# Patient Record
Sex: Female | Born: 1955 | Race: Black or African American | Hispanic: No | Marital: Single
Health system: Southern US, Community
[De-identification: ages and names within clinical notes are randomized; demographics above are authoritative.]

---

## 2010-02-25 ENCOUNTER — Ambulatory Visit: Payer: Self-pay | Admitting: Oncology

## 2010-03-13 ENCOUNTER — Inpatient Hospital Stay: Payer: Self-pay | Admitting: Internal Medicine

## 2010-03-28 ENCOUNTER — Ambulatory Visit: Payer: Self-pay | Admitting: Oncology

## 2010-04-05 ENCOUNTER — Inpatient Hospital Stay: Payer: Self-pay | Admitting: Internal Medicine

## 2010-09-01 ENCOUNTER — Inpatient Hospital Stay: Payer: Self-pay | Admitting: Internal Medicine

## 2010-11-08 ENCOUNTER — Inpatient Hospital Stay: Payer: Self-pay | Admitting: Internal Medicine

## 2011-05-19 ENCOUNTER — Ambulatory Visit: Payer: Self-pay | Admitting: Surgery

## 2012-01-27 ENCOUNTER — Ambulatory Visit: Payer: Self-pay | Admitting: Internal Medicine

## 2012-02-09 ENCOUNTER — Inpatient Hospital Stay: Payer: Self-pay | Admitting: Internal Medicine

## 2012-02-09 LAB — BASIC METABOLIC PANEL
BUN: 9 mg/dL (ref 7–18)
Calcium, Total: 7.1 mg/dL — ABNORMAL LOW (ref 8.5–10.1)
Chloride: 97 mmol/L — ABNORMAL LOW (ref 98–107)
Co2: 11 mmol/L — ABNORMAL LOW (ref 21–32)
Glucose: 127 mg/dL — ABNORMAL HIGH (ref 65–99)
Osmolality: 269 (ref 275–301)
Potassium: 3.4 mmol/L — ABNORMAL LOW (ref 3.5–5.1)
Sodium: 134 mmol/L — ABNORMAL LOW (ref 136–145)

## 2012-02-09 LAB — COMPREHENSIVE METABOLIC PANEL
Albumin: 3.6 g/dL (ref 3.4–5.0)
Alkaline Phosphatase: 88 U/L (ref 50–136)
Anion Gap: 30 — ABNORMAL HIGH (ref 7–16)
Bilirubin,Total: 0.7 mg/dL (ref 0.2–1.0)
Co2: 12 mmol/L — ABNORMAL LOW (ref 21–32)
Creatinine: 0.94 mg/dL (ref 0.60–1.30)
EGFR (Non-African Amer.): >60
Glucose: 122 mg/dL — ABNORMAL HIGH (ref 65–99)
Osmolality: 267 (ref 275–301)
Potassium: 5.5 mmol/L — ABNORMAL HIGH (ref 3.5–5.1)
Sodium: 133 mmol/L — ABNORMAL LOW (ref 136–145)

## 2012-02-09 LAB — CBC WITH DIFFERENTIAL/PLATELET
Eosinophil #: 0 10*3/uL (ref 0.0–0.7)
Eosinophil %: 0.1 %
HCT: 39.6 % (ref 35.0–47.0)
HGB: 12.5 g/dL (ref 12.0–16.0)
Lymphocyte #: 0.9 10*3/uL — ABNORMAL LOW (ref 1.0–3.6)
Lymphocyte %: 21.7 %
MCV: 91 fL (ref 80–100)
Monocyte %: 5 %
Neutrophil #: 3.1 10*3/uL (ref 1.4–6.5)
RBC: 4.37 10*6/uL (ref 3.80–5.20)
WBC: 4.3 10*3/uL (ref 3.6–11.0)

## 2012-02-09 LAB — URINALYSIS, COMPLETE
Bilirubin,UR: NEGATIVE
Glucose,UR: NEGATIVE mg/dL (ref 0–75)
Hyaline Cast: 8
Protein: 30
Squamous Epithelial: 1

## 2012-02-09 LAB — PROTIME-INR: INR: 1

## 2012-02-09 LAB — ETHANOL
Ethanol %: <0.003 % (ref 0.000–0.080)
Ethanol: <3 mg/dL

## 2012-02-09 LAB — POTASSIUM: Potassium: 4.3 mmol/L (ref 3.5–5.1)

## 2012-02-09 LAB — TROPONIN I: Troponin-I: <0.02 ng/mL

## 2012-02-10 LAB — HEMOGLOBIN A1C: Hemoglobin A1C: 5 % (ref 4.2–6.3)

## 2012-02-10 LAB — BASIC METABOLIC PANEL
Anion Gap: 13 (ref 7–16)
Anion Gap: 8 (ref 7–16)
BUN: 7 mg/dL (ref 7–18)
BUN: 4 mg/dL — ABNORMAL LOW (ref 7–18)
Calcium, Total: 6 mg/dL — CL (ref 8.5–10.1)
Chloride: 104 mmol/L (ref 98–107)
Creatinine: 0.93 mg/dL (ref 0.60–1.30)
EGFR (African American): >60
EGFR (African American): >60
Glucose: 139 mg/dL — ABNORMAL HIGH (ref 65–99)
Glucose: 120 mg/dL — ABNORMAL HIGH (ref 65–99)
Potassium: 3.1 mmol/L — ABNORMAL LOW (ref 3.5–5.1)
Potassium: 3.7 mmol/L (ref 3.5–5.1)
Sodium: 140 mmol/L (ref 136–145)

## 2012-02-10 LAB — CBC WITH DIFFERENTIAL/PLATELET
Basophil #: 0 10*3/uL (ref 0.0–0.1)
Basophil %: 0.7 %
Eosinophil #: 0 10*3/uL (ref 0.0–0.7)
HCT: 27.9 % — ABNORMAL LOW (ref 35.0–47.0)
Lymphocyte #: 1.4 10*3/uL (ref 1.0–3.6)
Lymphocyte %: 36.5 %
Monocyte #: 0.2 x10 3/mm (ref 0.2–0.9)
Neutrophil #: 2.2 10*3/uL (ref 1.4–6.5)
Neutrophil %: 57 %
RDW: 14.8 % — ABNORMAL HIGH (ref 11.5–14.5)
WBC: 3.9 10*3/uL (ref 3.6–11.0)

## 2012-02-10 LAB — MAGNESIUM: Magnesium: 1.6 mg/dL — ABNORMAL LOW

## 2012-02-10 LAB — TROPONIN I: Troponin-I: <0.02 ng/mL

## 2012-02-11 LAB — BASIC METABOLIC PANEL
Chloride: 100 mmol/L (ref 98–107)
Co2: 32 mmol/L (ref 21–32)
EGFR (Non-African Amer.): >60
Glucose: 217 mg/dL — ABNORMAL HIGH (ref 65–99)
Osmolality: 281 (ref 275–301)
Potassium: 3.3 mmol/L — ABNORMAL LOW (ref 3.5–5.1)
Sodium: 139 mmol/L (ref 136–145)

## 2012-02-11 LAB — SALICYLATE LEVEL: Salicylates, Serum: <1.7 mg/dL

## 2012-02-12 LAB — COMPREHENSIVE METABOLIC PANEL
Alkaline Phosphatase: 61 U/L (ref 50–136)
Anion Gap: 2 — ABNORMAL LOW (ref 7–16)
BUN: <1 mg/dL — ABNORMAL LOW (ref 7–18)
Bilirubin,Total: 0.4 mg/dL (ref 0.2–1.0)
Calcium, Total: 6.7 mg/dL — CL (ref 8.5–10.1)
Chloride: 99 mmol/L (ref 98–107)
Co2: 37 mmol/L — ABNORMAL HIGH (ref 21–32)
EGFR (African American): >60
EGFR (Non-African Amer.): >60
Glucose: 128 mg/dL — ABNORMAL HIGH (ref 65–99)
Potassium: 3.4 mmol/L — ABNORMAL LOW (ref 3.5–5.1)
SGOT(AST): 23 U/L (ref 15–37)
SGPT (ALT): 15 U/L (ref 12–78)
Sodium: 138 mmol/L (ref 136–145)

## 2012-02-12 LAB — MAGNESIUM
Magnesium: 1.4 mg/dL — ABNORMAL LOW
Magnesium: 1.7 mg/dL — ABNORMAL LOW

## 2012-02-12 LAB — POTASSIUM: Potassium: 3.1 mmol/L — ABNORMAL LOW (ref 3.5–5.1)

## 2012-02-12 LAB — PHOSPHORUS
Phosphorus: 0.3 mg/dL — CL (ref 2.5–4.9)
Phosphorus: 1.7 mg/dL — ABNORMAL LOW (ref 2.5–4.9)

## 2012-02-13 LAB — BASIC METABOLIC PANEL
Anion Gap: 5 — ABNORMAL LOW (ref 7–16)
Chloride: 97 mmol/L — ABNORMAL LOW (ref 98–107)
Co2: 36 mmol/L — ABNORMAL HIGH (ref 21–32)
Glucose: 230 mg/dL — ABNORMAL HIGH (ref 65–99)

## 2012-02-13 LAB — MAGNESIUM: Magnesium: 1.5 mg/dL — ABNORMAL LOW

## 2012-02-14 LAB — CBC WITH DIFFERENTIAL/PLATELET
Basophil #: 0 10*3/uL (ref 0.0–0.1)
Basophil %: 0.3 %
Eosinophil #: 0 10*3/uL (ref 0.0–0.7)
Eosinophil %: 0.3 %
Lymphocyte #: 0.3 10*3/uL — ABNORMAL LOW (ref 1.0–3.6)
MCH: 28.7 pg (ref 26.0–34.0)
MCHC: 33.1 g/dL (ref 32.0–36.0)
MCV: 87 fL (ref 80–100)
Neutrophil %: 87.3 %
Platelet: 124 10*3/uL — ABNORMAL LOW (ref 150–440)
RDW: 14.8 % — ABNORMAL HIGH (ref 11.5–14.5)

## 2012-02-14 LAB — PATHOLOGY REPORT

## 2012-02-14 LAB — POTASSIUM: Potassium: 2.7 mmol/L — ABNORMAL LOW (ref 3.5–5.1)

## 2012-02-14 LAB — MAGNESIUM
Magnesium: 1.6 mg/dL — ABNORMAL LOW
Magnesium: 2.5 mg/dL — ABNORMAL HIGH

## 2012-02-15 DIAGNOSIS — J984 Other disorders of lung: Secondary | ICD-10-CM

## 2012-02-15 LAB — BASIC METABOLIC PANEL
Anion Gap: 5 — ABNORMAL LOW (ref 7–16)
BUN: 2 mg/dL — ABNORMAL LOW (ref 7–18)
Calcium, Total: 6 mg/dL — CL (ref 8.5–10.1)
Chloride: 103 mmol/L (ref 98–107)
Co2: 32 mmol/L (ref 21–32)
EGFR (African American): >60
Glucose: 121 mg/dL — ABNORMAL HIGH (ref 65–99)
Osmolality: 277 (ref 275–301)
Potassium: 4.1 mmol/L (ref 3.5–5.1)

## 2012-02-15 LAB — CBC WITH DIFFERENTIAL/PLATELET
Basophil %: 0.1 %
Eosinophil %: 0.4 %
HCT: 24.4 % — ABNORMAL LOW (ref 35.0–47.0)
HGB: 8.1 g/dL — ABNORMAL LOW (ref 12.0–16.0)
Lymphocyte %: 12.7 %
MCH: 28.7 pg (ref 26.0–34.0)
MCHC: 33.3 g/dL (ref 32.0–36.0)
Neutrophil #: 4.1 10*3/uL (ref 1.4–6.5)
Neutrophil %: 78.6 %
RBC: 2.84 10*6/uL — ABNORMAL LOW (ref 3.80–5.20)

## 2012-02-15 LAB — URINALYSIS, COMPLETE
Nitrite: NEGATIVE
Ph: 5 (ref 4.5–8.0)
Protein: NEGATIVE
Specific Gravity: 1.034 (ref 1.003–1.030)
Squamous Epithelial: 1

## 2012-02-16 LAB — BASIC METABOLIC PANEL
Anion Gap: 7 (ref 7–16)
Calcium, Total: 5.8 mg/dL — CL (ref 8.5–10.1)
Chloride: 104 mmol/L (ref 98–107)
Co2: 29 mmol/L (ref 21–32)
Creatinine: 0.89 mg/dL (ref 0.60–1.30)
EGFR (African American): >60
Glucose: 144 mg/dL — ABNORMAL HIGH (ref 65–99)
Osmolality: 278 (ref 275–301)
Potassium: 4.1 mmol/L (ref 3.5–5.1)
Sodium: 140 mmol/L (ref 136–145)

## 2012-02-16 LAB — PHOSPHORUS
Phosphorus: 2 mg/dL — ABNORMAL LOW (ref 2.5–4.9)
Phosphorus: 2.1 mg/dL — ABNORMAL LOW (ref 2.5–4.9)

## 2012-02-16 LAB — CBC WITH DIFFERENTIAL/PLATELET
Basophil #: 0 10*3/uL (ref 0.0–0.1)
Basophil %: 0.3 %
Eosinophil #: 0.1 10*3/uL (ref 0.0–0.7)
HCT: 21.7 % — ABNORMAL LOW (ref 35.0–47.0)
HGB: 7.2 g/dL — ABNORMAL LOW (ref 12.0–16.0)
MCH: 28.7 pg (ref 26.0–34.0)
MCHC: 33.4 g/dL (ref 32.0–36.0)
MCV: 86 fL (ref 80–100)
Monocyte #: 0.3 x10 3/mm (ref 0.2–0.9)
Neutrophil #: 4.3 10*3/uL (ref 1.4–6.5)
Neutrophil %: 80.3 %
Platelet: 116 10*3/uL — ABNORMAL LOW (ref 150–440)
RBC: 2.52 10*6/uL — ABNORMAL LOW (ref 3.80–5.20)
RDW: 15 % — ABNORMAL HIGH (ref 11.5–14.5)

## 2012-02-16 LAB — MAGNESIUM
Magnesium: 1.1 mg/dL — ABNORMAL LOW
Magnesium: 2 mg/dL

## 2012-02-16 LAB — HEMOGLOBIN: HGB: 7.6 g/dL — ABNORMAL LOW (ref 12.0–16.0)

## 2012-02-16 LAB — ALBUMIN: Albumin: 1.7 g/dL — ABNORMAL LOW (ref 3.4–5.0)

## 2012-02-17 LAB — PHOSPHORUS: Phosphorus: 2.2 mg/dL — ABNORMAL LOW (ref 2.5–4.9)

## 2012-02-17 LAB — CBC WITH DIFFERENTIAL/PLATELET
Basophil #: 0 10*3/uL (ref 0.0–0.1)
Basophil %: 0.4 %
Eosinophil #: 0 10*3/uL (ref 0.0–0.7)
Lymphocyte #: 0.5 10*3/uL — ABNORMAL LOW (ref 1.0–3.6)
Lymphocyte %: 6 %
MCH: 28.5 pg (ref 26.0–34.0)
MCHC: 33.1 g/dL (ref 32.0–36.0)
MCV: 86 fL (ref 80–100)
Neutrophil #: 6.7 10*3/uL — ABNORMAL HIGH (ref 1.4–6.5)
RBC: 2.88 10*6/uL — ABNORMAL LOW (ref 3.80–5.20)
RDW: 15.2 % — ABNORMAL HIGH (ref 11.5–14.5)
WBC: 7.6 10*3/uL (ref 3.6–11.0)

## 2012-02-17 LAB — CALCIUM: Calcium, Total: 6.3 mg/dL — CL (ref 8.5–10.1)

## 2012-02-17 LAB — POTASSIUM
Potassium: 4.2 mmol/L (ref 3.5–5.1)
Potassium: 4.5 mmol/L (ref 3.5–5.1)

## 2012-02-17 LAB — MAGNESIUM
Magnesium: 1.6 mg/dL — ABNORMAL LOW
Magnesium: 1.5 mg/dL — ABNORMAL LOW

## 2012-02-18 LAB — MAGNESIUM: Magnesium: 1.8 mg/dL

## 2012-02-18 LAB — CBC WITH DIFFERENTIAL/PLATELET
Basophil #: 0 10*3/uL (ref 0.0–0.1)
Eosinophil #: 0 10*3/uL (ref 0.0–0.7)
HGB: 7.2 g/dL — ABNORMAL LOW (ref 12.0–16.0)
Lymphocyte %: 6.8 %
MCHC: 33.2 g/dL (ref 32.0–36.0)
Monocyte %: 6.2 %
Neutrophil %: 86.5 %
Platelet: 116 10*3/uL — ABNORMAL LOW (ref 150–440)
RDW: 14.9 % — ABNORMAL HIGH (ref 11.5–14.5)
WBC: 8.6 10*3/uL (ref 3.6–11.0)

## 2012-02-18 LAB — PHOSPHORUS: Phosphorus: 2.6 mg/dL (ref 2.5–4.9)

## 2012-02-18 LAB — POTASSIUM: Potassium: 4.5 mmol/L (ref 3.5–5.1)

## 2012-02-19 LAB — HEMOGLOBIN A1C: Hemoglobin A1C: 5.1 % (ref 4.2–6.3)

## 2012-02-19 LAB — CBC WITH DIFFERENTIAL/PLATELET
Bands: 4 %
HCT: 21.5 % — ABNORMAL LOW (ref 35.0–47.0)
HGB: 7.2 g/dL — ABNORMAL LOW (ref 12.0–16.0)
Lymphocytes: 8 %
MCH: 28.4 pg (ref 26.0–34.0)
MCV: 85 fL (ref 80–100)
Monocytes: 6 %
NRBC/100 WBC: 4 /
Platelet: 119 10*3/uL — ABNORMAL LOW (ref 150–440)
RBC: 2.52 10*6/uL — ABNORMAL LOW (ref 3.80–5.20)
WBC: 8.8 10*3/uL (ref 3.6–11.0)

## 2012-02-19 LAB — PHOSPHORUS: Phosphorus: 0.7 mg/dL — CL (ref 2.5–4.9)

## 2012-02-19 LAB — MAGNESIUM: Magnesium: 1.8 mg/dL

## 2012-02-19 LAB — CALCIUM: Calcium, Total: 6.8 mg/dL — CL (ref 8.5–10.1)

## 2012-02-19 LAB — POTASSIUM
Potassium: 3.4 mmol/L — ABNORMAL LOW (ref 3.5–5.1)
Potassium: 3.8 mmol/L (ref 3.5–5.1)

## 2012-02-20 LAB — PHOSPHORUS: Phosphorus: 1.5 mg/dL — ABNORMAL LOW (ref 2.5–4.9)

## 2012-02-20 LAB — SODIUM: Sodium: 141 mmol/L (ref 136–145)

## 2012-02-20 LAB — CREATININE, SERUM
EGFR (African American): >60
EGFR (Non-African Amer.): >60

## 2012-02-20 LAB — POTASSIUM: Potassium: 3.3 mmol/L — ABNORMAL LOW (ref 3.5–5.1)

## 2012-02-20 LAB — CALCIUM: Calcium, Total: 6.9 mg/dL — CL (ref 8.5–10.1)

## 2012-02-21 LAB — CULTURE, BLOOD (SINGLE)

## 2012-02-21 LAB — CBC WITH DIFFERENTIAL/PLATELET
HCT: 24 % — ABNORMAL LOW (ref 35.0–47.0)
HGB: 7.6 g/dL — ABNORMAL LOW (ref 12.0–16.0)
MCH: 28.2 pg (ref 26.0–34.0)
MCHC: 31.8 g/dL — ABNORMAL LOW (ref 32.0–36.0)
MCV: 89 fL (ref 80–100)
Metamyelocyte: 3 %
Myelocyte: 1 %
NRBC/100 WBC: 11 /
RDW: 15.4 % — ABNORMAL HIGH (ref 11.5–14.5)
Segmented Neutrophils: 83 %

## 2012-02-21 LAB — BASIC METABOLIC PANEL
BUN: 16 mg/dL (ref 7–18)
Calcium, Total: 6.4 mg/dL — CL (ref 8.5–10.1)
Chloride: 103 mmol/L (ref 98–107)
Osmolality: 290 (ref 275–301)
Potassium: 2.3 mmol/L — CL (ref 3.5–5.1)
Sodium: 142 mmol/L (ref 136–145)

## 2012-02-21 LAB — PHOSPHORUS
Phosphorus: 2.4 mg/dL — ABNORMAL LOW (ref 2.5–4.9)
Phosphorus: 3.4 mg/dL (ref 2.5–4.9)

## 2012-02-21 LAB — MAGNESIUM: Magnesium: 1.9 mg/dL

## 2012-02-22 LAB — CBC WITH DIFFERENTIAL/PLATELET
Eosinophil: 1 %
HCT: 23 % — ABNORMAL LOW (ref 35.0–47.0)
NRBC/100 WBC: 9 /
Platelet: 176 10*3/uL (ref 150–440)
RBC: 2.57 10*6/uL — ABNORMAL LOW (ref 3.80–5.20)
Segmented Neutrophils: 86 %
WBC: 6.5 10*3/uL (ref 3.6–11.0)

## 2012-02-22 LAB — PHOSPHORUS: Phosphorus: 3.1 mg/dL (ref 2.5–4.9)

## 2012-02-22 LAB — BASIC METABOLIC PANEL
Anion Gap: 15 (ref 7–16)
BUN: 14 mg/dL (ref 7–18)
Calcium, Total: 6.5 mg/dL — CL (ref 8.5–10.1)
Chloride: 102 mmol/L (ref 98–107)
Creatinine: 0.74 mg/dL (ref 0.60–1.30)
EGFR (African American): >60
Osmolality: 291 (ref 275–301)
Potassium: 2.6 mmol/L — ABNORMAL LOW (ref 3.5–5.1)

## 2012-02-22 LAB — POTASSIUM: Potassium: 3.5 mmol/L (ref 3.5–5.1)

## 2012-02-23 LAB — CBC WITH DIFFERENTIAL/PLATELET
HCT: 24.7 % — ABNORMAL LOW (ref 35.0–47.0)
MCHC: 31.8 g/dL — ABNORMAL LOW (ref 32.0–36.0)
MCV: 89 fL (ref 80–100)
Monocytes: 7 %
NRBC/100 WBC: 8 /
Platelet: 201 10*3/uL (ref 150–440)
RBC: 2.77 10*6/uL — ABNORMAL LOW (ref 3.80–5.20)
Variant Lymphocyte - H1-Rlymph: 2 %
WBC: 4.9 10*3/uL (ref 3.6–11.0)

## 2012-02-23 LAB — COMPREHENSIVE METABOLIC PANEL
Albumin: 2 g/dL — ABNORMAL LOW (ref 3.4–5.0)
Alkaline Phosphatase: 482 U/L — ABNORMAL HIGH (ref 50–136)
BUN: 10 mg/dL (ref 7–18)
Calcium, Total: 7.2 mg/dL — ABNORMAL LOW (ref 8.5–10.1)
Creatinine: 0.73 mg/dL (ref 0.60–1.30)
Glucose: 155 mg/dL — ABNORMAL HIGH (ref 65–99)
Osmolality: 283 (ref 275–301)
SGOT(AST): 35 U/L (ref 15–37)
SGPT (ALT): 27 U/L (ref 12–78)
Sodium: 141 mmol/L (ref 136–145)
Total Protein: 5.2 g/dL — ABNORMAL LOW (ref 6.4–8.2)

## 2012-02-23 LAB — POTASSIUM: Potassium: 3.5 mmol/L (ref 3.5–5.1)

## 2012-02-23 LAB — MAGNESIUM: Magnesium: 1.7 mg/dL — ABNORMAL LOW

## 2012-02-24 LAB — BASIC METABOLIC PANEL
Anion Gap: 11 (ref 7–16)
BUN: 9 mg/dL (ref 7–18)
Co2: 28 mmol/L (ref 21–32)
Creatinine: 0.71 mg/dL (ref 0.60–1.30)
EGFR (African American): >60
Osmolality: 287 (ref 275–301)

## 2012-02-25 LAB — POTASSIUM: Potassium: 2.9 mmol/L — ABNORMAL LOW (ref 3.5–5.1)

## 2012-02-25 LAB — SODIUM: Sodium: 140 mmol/L (ref 136–145)

## 2012-02-25 LAB — CALCIUM: Calcium, Total: 6.8 mg/dL — CL (ref 8.5–10.1)

## 2012-02-25 LAB — ALBUMIN: Albumin: 2.1 g/dL — ABNORMAL LOW (ref 3.4–5.0)

## 2012-02-25 LAB — PHOSPHORUS: Phosphorus: 2 mg/dL — ABNORMAL LOW (ref 2.5–4.9)

## 2012-02-26 ENCOUNTER — Ambulatory Visit: Payer: Self-pay | Admitting: Internal Medicine

## 2012-02-26 LAB — CBC WITH DIFFERENTIAL/PLATELET
HCT: 28.5 % — ABNORMAL LOW (ref 35.0–47.0)
HGB: 8.9 g/dL — ABNORMAL LOW (ref 12.0–16.0)
MCH: 28.7 pg (ref 26.0–34.0)
MCHC: 31.4 g/dL — ABNORMAL LOW (ref 32.0–36.0)
MCV: 91 fL (ref 80–100)
Monocytes: 5 %
NRBC/100 WBC: 10 /
RBC: 3.12 10*6/uL — ABNORMAL LOW (ref 3.80–5.20)
Segmented Neutrophils: 75 %
WBC: 7 10*3/uL (ref 3.6–11.0)

## 2012-02-26 LAB — COMPREHENSIVE METABOLIC PANEL
Albumin: 2.2 g/dL — ABNORMAL LOW (ref 3.4–5.0)
Alkaline Phosphatase: 511 U/L — ABNORMAL HIGH (ref 50–136)
Bilirubin,Total: 0.9 mg/dL (ref 0.2–1.0)
Co2: 28 mmol/L (ref 21–32)
Creatinine: 0.76 mg/dL (ref 0.60–1.30)
Glucose: 152 mg/dL — ABNORMAL HIGH (ref 65–99)
Osmolality: 277 (ref 275–301)
Potassium: 4.5 mmol/L (ref 3.5–5.1)
SGOT(AST): 53 U/L — ABNORMAL HIGH (ref 15–37)
Sodium: 138 mmol/L (ref 136–145)

## 2012-02-27 LAB — HEPATIC FUNCTION PANEL A (ARMC)
Albumin: 2.4 g/dL — ABNORMAL LOW (ref 3.4–5.0)
Alkaline Phosphatase: 653 U/L — ABNORMAL HIGH (ref 50–136)
Bilirubin, Direct: 0.5 mg/dL — ABNORMAL HIGH (ref 0.00–0.20)
SGOT(AST): 49 U/L — ABNORMAL HIGH (ref 15–37)
SGPT (ALT): 34 U/L (ref 12–78)
Total Protein: 6.2 g/dL — ABNORMAL LOW (ref 6.4–8.2)

## 2012-02-27 LAB — SODIUM: Sodium: 134 mmol/L — ABNORMAL LOW (ref 136–145)

## 2013-05-26 ENCOUNTER — Inpatient Hospital Stay: Payer: Self-pay | Admitting: Family Medicine

## 2013-05-26 LAB — COMPREHENSIVE METABOLIC PANEL
ANION GAP: 28 — AB (ref 7–16)
Albumin: 2.6 g/dL — ABNORMAL LOW (ref 3.4–5.0)
Alkaline Phosphatase: 119 U/L — ABNORMAL HIGH
BILIRUBIN TOTAL: 0.5 mg/dL (ref 0.2–1.0)
BUN: 5 mg/dL — ABNORMAL LOW (ref 7–18)
CALCIUM: 6.1 mg/dL — AB (ref 8.5–10.1)
CHLORIDE: 94 mmol/L — AB (ref 98–107)
CO2: 14 mmol/L — AB (ref 21–32)
CREATININE: 0.81 mg/dL (ref 0.60–1.30)
EGFR (Non-African Amer.): >60
Glucose: 255 mg/dL — ABNORMAL HIGH (ref 65–99)
Osmolality: 278 (ref 275–301)
POTASSIUM: 3.2 mmol/L — AB (ref 3.5–5.1)
SGOT(AST): 193 U/L — ABNORMAL HIGH (ref 15–37)
SGPT (ALT): 46 U/L (ref 12–78)
Sodium: 136 mmol/L (ref 136–145)
Total Protein: 6.8 g/dL (ref 6.4–8.2)

## 2013-05-26 LAB — DRUG SCREEN, URINE
AMPHETAMINES, UR SCREEN: NEGATIVE (ref ?–1000)
BARBITURATES, UR SCREEN: NEGATIVE (ref ?–200)
BENZODIAZEPINE, UR SCRN: NEGATIVE (ref ?–200)
Cannabinoid 50 Ng, Ur ~~LOC~~: NEGATIVE (ref ?–50)
Cocaine Metabolite,Ur ~~LOC~~: NEGATIVE (ref ?–300)
MDMA (ECSTASY) UR SCREEN: NEGATIVE (ref ?–500)
Methadone, Ur Screen: NEGATIVE (ref ?–300)
OPIATE, UR SCREEN: NEGATIVE (ref ?–300)
Phencyclidine (PCP) Ur S: NEGATIVE (ref ?–25)
TRICYCLIC, UR SCREEN: NEGATIVE (ref ?–1000)

## 2013-05-26 LAB — URINALYSIS, COMPLETE
Bilirubin,UR: NEGATIVE
Blood: NEGATIVE
Glucose,UR: 50 mg/dL (ref 0–75)
LEUKOCYTE ESTERASE: NEGATIVE
Nitrite: NEGATIVE
PH: 7 (ref 4.5–8.0)
RBC,UR: <1 /HPF (ref 0–5)
SPECIFIC GRAVITY: 1.008 (ref 1.003–1.030)

## 2013-05-26 LAB — CBC
HCT: 37.8 % (ref 35.0–47.0)
HGB: 12.2 g/dL (ref 12.0–16.0)
MCH: 25.4 pg — AB (ref 26.0–34.0)
MCHC: 32.2 g/dL (ref 32.0–36.0)
MCV: 79 fL — ABNORMAL LOW (ref 80–100)
Platelet: 153 10*3/uL (ref 150–440)
RBC: 4.78 10*6/uL (ref 3.80–5.20)
RDW: 15.5 % — ABNORMAL HIGH (ref 11.5–14.5)
WBC: 4.9 10*3/uL (ref 3.6–11.0)

## 2013-05-26 LAB — BASIC METABOLIC PANEL
ANION GAP: 16 (ref 7–16)
BUN: 5 mg/dL — ABNORMAL LOW (ref 7–18)
CREATININE: 0.81 mg/dL (ref 0.60–1.30)
Calcium, Total: 7.1 mg/dL — ABNORMAL LOW (ref 8.5–10.1)
Chloride: 94 mmol/L — ABNORMAL LOW (ref 98–107)
Co2: 25 mmol/L (ref 21–32)
EGFR (Non-African Amer.): >60
Glucose: 198 mg/dL — ABNORMAL HIGH (ref 65–99)
OSMOLALITY: 273 (ref 275–301)
Potassium: 4.7 mmol/L (ref 3.5–5.1)
SODIUM: 135 mmol/L — AB (ref 136–145)

## 2013-05-26 LAB — MAGNESIUM: MAGNESIUM: 1.6 mg/dL — AB

## 2013-05-26 LAB — PROTIME-INR
INR: 1
Prothrombin Time: 13.5 secs (ref 11.5–14.7)

## 2013-05-26 LAB — ETHANOL
ETHANOL LVL: 71 mg/dL
Ethanol %: 0.071 % (ref 0.000–0.080)

## 2013-05-26 LAB — TROPONIN I: Troponin-I: <0.02 ng/mL

## 2013-05-27 LAB — CBC WITH DIFFERENTIAL/PLATELET
BASOS PCT: 1.2 %
Basophil #: 0.1 10*3/uL (ref 0.0–0.1)
Eosinophil #: 0.2 10*3/uL (ref 0.0–0.7)
Eosinophil %: 5.1 %
HCT: 35.1 % (ref 35.0–47.0)
HGB: 11.4 g/dL — AB (ref 12.0–16.0)
LYMPHS ABS: 1.7 10*3/uL (ref 1.0–3.6)
Lymphocyte %: 36.7 %
MCH: 25.3 pg — AB (ref 26.0–34.0)
MCHC: 32.4 g/dL (ref 32.0–36.0)
MCV: 78 fL — ABNORMAL LOW (ref 80–100)
MONO ABS: 0.4 x10 3/mm (ref 0.2–0.9)
MONOS PCT: 9.4 %
Neutrophil #: 2.2 10*3/uL (ref 1.4–6.5)
Neutrophil %: 47.6 %
PLATELETS: 139 10*3/uL — AB (ref 150–440)
RBC: 4.49 10*6/uL (ref 3.80–5.20)
RDW: 15.1 % — ABNORMAL HIGH (ref 11.5–14.5)
WBC: 4.5 10*3/uL (ref 3.6–11.0)

## 2013-05-27 LAB — HEMOGLOBIN A1C: Hemoglobin A1C: 7.5 % — ABNORMAL HIGH (ref 4.2–6.3)

## 2013-05-27 LAB — COMPREHENSIVE METABOLIC PANEL
ALT: 33 U/L (ref 12–78)
Albumin: 2.4 g/dL — ABNORMAL LOW (ref 3.4–5.0)
Alkaline Phosphatase: 100 U/L
Anion Gap: 14 (ref 7–16)
BILIRUBIN TOTAL: 0.7 mg/dL (ref 0.2–1.0)
BUN: 5 mg/dL — ABNORMAL LOW (ref 7–18)
Calcium, Total: 7.3 mg/dL — ABNORMAL LOW (ref 8.5–10.1)
Chloride: 91 mmol/L — ABNORMAL LOW (ref 98–107)
Co2: 31 mmol/L (ref 21–32)
Creatinine: 0.78 mg/dL (ref 0.60–1.30)
EGFR (African American): >60
EGFR (Non-African Amer.): >60
Glucose: 162 mg/dL — ABNORMAL HIGH (ref 65–99)
Osmolality: 273 (ref 275–301)
POTASSIUM: 3.6 mmol/L (ref 3.5–5.1)
SGOT(AST): 68 U/L — ABNORMAL HIGH (ref 15–37)
SODIUM: 136 mmol/L (ref 136–145)
Total Protein: 6.1 g/dL — ABNORMAL LOW (ref 6.4–8.2)

## 2013-05-27 LAB — LIPID PANEL
Cholesterol: 163 mg/dL (ref 0–200)
HDL Cholesterol: 95 mg/dL — ABNORMAL HIGH (ref 40–60)
Ldl Cholesterol, Calc: 55 mg/dL (ref 0–100)
TRIGLYCERIDES: 67 mg/dL (ref 0–200)
VLDL Cholesterol, Calc: 13 mg/dL (ref 5–40)

## 2013-05-27 LAB — TSH: THYROID STIMULATING HORM: 10.2 u[IU]/mL — AB

## 2013-05-27 LAB — MAGNESIUM: MAGNESIUM: 1.8 mg/dL

## 2013-05-31 LAB — CULTURE, BLOOD (SINGLE)

## 2013-05-31 LAB — URINE CULTURE

## 2013-06-13 ENCOUNTER — Emergency Department: Payer: Self-pay | Admitting: Emergency Medicine

## 2013-06-13 LAB — URINALYSIS, COMPLETE
Bilirubin,UR: NEGATIVE
Glucose,UR: NEGATIVE mg/dL (ref 0–75)
Hyaline Cast: 2
NITRITE: NEGATIVE
Ph: 7 (ref 4.5–8.0)
RBC,UR: 2 /HPF (ref 0–5)
Specific Gravity: 1.032 (ref 1.003–1.030)
Squamous Epithelial: 1
WBC UR: 10 /HPF (ref 0–5)

## 2013-06-13 LAB — CBC
HCT: 41.7 % (ref 35.0–47.0)
HGB: 13 g/dL (ref 12.0–16.0)
MCH: 25.3 pg — AB (ref 26.0–34.0)
MCHC: 31 g/dL — AB (ref 32.0–36.0)
MCV: 81 fL (ref 80–100)
Platelet: 191 10*3/uL (ref 150–440)
RBC: 5.13 10*6/uL (ref 3.80–5.20)
RDW: 16.4 % — ABNORMAL HIGH (ref 11.5–14.5)
WBC: 5.7 10*3/uL (ref 3.6–11.0)

## 2013-06-13 LAB — COMPREHENSIVE METABOLIC PANEL
Albumin: 2.7 g/dL — ABNORMAL LOW (ref 3.4–5.0)
Alkaline Phosphatase: 110 U/L
Anion Gap: 16 (ref 7–16)
BILIRUBIN TOTAL: 0.5 mg/dL (ref 0.2–1.0)
BUN: 11 mg/dL (ref 7–18)
CALCIUM: 6.2 mg/dL — AB (ref 8.5–10.1)
CREATININE: 1.14 mg/dL (ref 0.60–1.30)
Chloride: 100 mmol/L (ref 98–107)
Co2: 26 mmol/L (ref 21–32)
GFR CALC NON AF AMER: 52 — AB
Glucose: 222 mg/dL — ABNORMAL HIGH (ref 65–99)
Osmolality: 289 (ref 275–301)
POTASSIUM: 3.1 mmol/L — AB (ref 3.5–5.1)
SGOT(AST): 53 U/L — ABNORMAL HIGH (ref 15–37)
SGPT (ALT): 35 U/L (ref 12–78)
SODIUM: 142 mmol/L (ref 136–145)
TOTAL PROTEIN: 6.8 g/dL (ref 6.4–8.2)

## 2013-06-13 LAB — MAGNESIUM: Magnesium: 1.9 mg/dL

## 2013-06-13 LAB — PHOSPHORUS: Phosphorus: 3.7 mg/dL (ref 2.5–4.9)

## 2013-06-13 LAB — LIPASE, BLOOD: Lipase: 49 U/L — ABNORMAL LOW (ref 73–393)

## 2013-07-06 ENCOUNTER — Emergency Department: Payer: Self-pay | Admitting: Emergency Medicine

## 2013-07-06 LAB — CBC
HCT: 35.2 % (ref 35.0–47.0)
HGB: 10.8 g/dL — AB (ref 12.0–16.0)
MCH: 25 pg — ABNORMAL LOW (ref 26.0–34.0)
MCHC: 30.8 g/dL — AB (ref 32.0–36.0)
MCV: 81 fL (ref 80–100)
Platelet: 202 10*3/uL (ref 150–440)
RBC: 4.32 10*6/uL (ref 3.80–5.20)
RDW: 18.3 % — ABNORMAL HIGH (ref 11.5–14.5)
WBC: 6.3 10*3/uL (ref 3.6–11.0)

## 2013-07-06 LAB — URINALYSIS, COMPLETE
Bilirubin,UR: NEGATIVE
Blood: NEGATIVE
Glucose,UR: NEGATIVE mg/dL (ref 0–75)
KETONE: NEGATIVE
NITRITE: NEGATIVE
Ph: 7 (ref 4.5–8.0)
Protein: NEGATIVE
RBC,UR: <1 /HPF (ref 0–5)
Specific Gravity: 1.006 (ref 1.003–1.030)
Squamous Epithelial: 1

## 2013-07-06 LAB — COMPREHENSIVE METABOLIC PANEL
ALK PHOS: 149 U/L — AB
ALT: 19 U/L (ref 12–78)
ANION GAP: 13 (ref 7–16)
Albumin: 2.3 g/dL — ABNORMAL LOW (ref 3.4–5.0)
BUN: 3 mg/dL — AB (ref 7–18)
Bilirubin,Total: 0.5 mg/dL (ref 0.2–1.0)
CREATININE: 0.76 mg/dL (ref 0.60–1.30)
Calcium, Total: 5.7 mg/dL — CL (ref 8.5–10.1)
Chloride: 104 mmol/L (ref 98–107)
Co2: 25 mmol/L (ref 21–32)
EGFR (African American): >60
EGFR (Non-African Amer.): >60
Glucose: 117 mg/dL — ABNORMAL HIGH (ref 65–99)
OSMOLALITY: 281 (ref 275–301)
POTASSIUM: 2.9 mmol/L — AB (ref 3.5–5.1)
SGOT(AST): 28 U/L (ref 15–37)
Sodium: 142 mmol/L (ref 136–145)
TOTAL PROTEIN: 6.2 g/dL — AB (ref 6.4–8.2)

## 2013-07-06 LAB — APTT: ACTIVATED PTT: 29.1 s (ref 23.6–35.9)

## 2013-07-06 LAB — PROTIME-INR
INR: 1.2
PROTHROMBIN TIME: 15.2 s — AB (ref 11.5–14.7)

## 2013-10-19 LAB — COMPREHENSIVE METABOLIC PANEL
ALK PHOS: 183 U/L — AB
ANION GAP: 37 — AB (ref 7–16)
Albumin: 2.7 g/dL — ABNORMAL LOW (ref 3.4–5.0)
BILIRUBIN TOTAL: 0.6 mg/dL (ref 0.2–1.0)
BUN: 9 mg/dL (ref 7–18)
CO2: 9 mmol/L — AB (ref 21–32)
Calcium, Total: 8.3 mg/dL — ABNORMAL LOW (ref 8.5–10.1)
Chloride: 98 mmol/L (ref 98–107)
Creatinine: 0.85 mg/dL (ref 0.60–1.30)
EGFR (Non-African Amer.): >60
GLUCOSE: 63 mg/dL — AB (ref 65–99)
Osmolality: 284 (ref 275–301)
POTASSIUM: 5 mmol/L (ref 3.5–5.1)
SGOT(AST): 85 U/L — ABNORMAL HIGH (ref 15–37)
SGPT (ALT): 34 U/L
Sodium: 144 mmol/L (ref 136–145)
Total Protein: 7.2 g/dL (ref 6.4–8.2)

## 2013-10-19 LAB — CBC WITH DIFFERENTIAL/PLATELET
BASOS PCT: 0.9 %
Basophil #: 0.1 10*3/uL (ref 0.0–0.1)
EOS ABS: 0 10*3/uL (ref 0.0–0.7)
EOS PCT: 0.6 %
HCT: 40.9 % (ref 35.0–47.0)
HGB: 12.2 g/dL (ref 12.0–16.0)
LYMPHS ABS: 1.5 10*3/uL (ref 1.0–3.6)
Lymphocyte %: 22.3 %
MCH: 24.4 pg — AB (ref 26.0–34.0)
MCHC: 29.8 g/dL — ABNORMAL LOW (ref 32.0–36.0)
MCV: 82 fL (ref 80–100)
MONO ABS: 0.2 x10 3/mm (ref 0.2–0.9)
MONOS PCT: 3.4 %
NEUTROS ABS: 5 10*3/uL (ref 1.4–6.5)
Neutrophil %: 72.8 %
PLATELETS: 253 10*3/uL (ref 150–440)
RBC: 4.99 10*6/uL (ref 3.80–5.20)
RDW: 17.1 % — ABNORMAL HIGH (ref 11.5–14.5)
WBC: 6.9 10*3/uL (ref 3.6–11.0)

## 2013-10-19 LAB — TROPONIN I: Troponin-I: <0.02 ng/mL

## 2013-10-19 LAB — CK TOTAL AND CKMB (NOT AT ARMC)
CK, TOTAL: 193 U/L — AB
CK-MB: 1.3 ng/mL (ref 0.5–3.6)

## 2013-10-20 ENCOUNTER — Inpatient Hospital Stay: Payer: Self-pay | Admitting: Internal Medicine

## 2013-10-20 LAB — BASIC METABOLIC PANEL
Anion Gap: 23 — ABNORMAL HIGH (ref 7–16)
BUN: 8 mg/dL (ref 7–18)
CREATININE: 1.2 mg/dL (ref 0.60–1.30)
Calcium, Total: 6.9 mg/dL — CL (ref 8.5–10.1)
Chloride: 103 mmol/L (ref 98–107)
Co2: 15 mmol/L — ABNORMAL LOW (ref 21–32)
EGFR (African American): 57 — ABNORMAL LOW
EGFR (Non-African Amer.): 49 — ABNORMAL LOW
Glucose: 181 mg/dL — ABNORMAL HIGH (ref 65–99)
Osmolality: 284 (ref 275–301)
Potassium: 3.5 mmol/L (ref 3.5–5.1)
Sodium: 141 mmol/L (ref 136–145)

## 2013-10-20 LAB — URINALYSIS, COMPLETE
Bilirubin,UR: NEGATIVE
Leukocyte Esterase: NEGATIVE
NITRITE: NEGATIVE
Ph: 6 (ref 4.5–8.0)
Protein: 30
Specific Gravity: 1.013 (ref 1.003–1.030)
WBC UR: 9 /HPF (ref 0–5)

## 2013-10-21 LAB — BASIC METABOLIC PANEL
Anion Gap: 20 — ABNORMAL HIGH (ref 7–16)
BUN: 3 mg/dL — AB (ref 7–18)
CALCIUM: 6.9 mg/dL — AB (ref 8.5–10.1)
CHLORIDE: 92 mmol/L — AB (ref 98–107)
Co2: 24 mmol/L (ref 21–32)
Creatinine: 0.64 mg/dL (ref 0.60–1.30)
EGFR (African American): >60
EGFR (Non-African Amer.): >60
Glucose: 183 mg/dL — ABNORMAL HIGH (ref 65–99)
Osmolality: 273 (ref 275–301)
POTASSIUM: 2.7 mmol/L — AB (ref 3.5–5.1)
Sodium: 136 mmol/L (ref 136–145)

## 2013-10-22 LAB — BASIC METABOLIC PANEL
ANION GAP: 8 (ref 7–16)
BUN: 3 mg/dL — AB (ref 7–18)
CALCIUM: 6.5 mg/dL — AB (ref 8.5–10.1)
Chloride: 101 mmol/L (ref 98–107)
Co2: 29 mmol/L (ref 21–32)
Creatinine: 1.08 mg/dL (ref 0.60–1.30)
EGFR (Non-African Amer.): 56 — ABNORMAL LOW
Glucose: 222 mg/dL — ABNORMAL HIGH (ref 65–99)
OSMOLALITY: 279 (ref 275–301)
POTASSIUM: 3.1 mmol/L — AB (ref 3.5–5.1)
Sodium: 138 mmol/L (ref 136–145)

## 2013-12-05 ENCOUNTER — Emergency Department: Payer: Self-pay | Admitting: Emergency Medicine

## 2013-12-05 LAB — URINALYSIS, COMPLETE
Bilirubin,UR: NEGATIVE
Blood: NEGATIVE
Glucose,UR: NEGATIVE mg/dL (ref 0–75)
KETONE: NEGATIVE
Nitrite: NEGATIVE
Ph: 6 (ref 4.5–8.0)
Protein: NEGATIVE
Specific Gravity: 1.002 (ref 1.003–1.030)

## 2013-12-05 LAB — COMPREHENSIVE METABOLIC PANEL
Albumin: 2.3 g/dL — ABNORMAL LOW (ref 3.4–5.0)
Alkaline Phosphatase: 145 U/L — ABNORMAL HIGH
Anion Gap: 10 (ref 7–16)
BUN: 4 mg/dL — ABNORMAL LOW (ref 7–18)
Bilirubin,Total: 0.2 mg/dL (ref 0.2–1.0)
Calcium, Total: 8 mg/dL — ABNORMAL LOW (ref 8.5–10.1)
Chloride: 96 mmol/L — ABNORMAL LOW (ref 98–107)
Co2: 29 mmol/L (ref 21–32)
Creatinine: 0.74 mg/dL (ref 0.60–1.30)
EGFR (African American): >60
EGFR (Non-African Amer.): >60
Glucose: 98 mg/dL (ref 65–99)
Osmolality: 267 (ref 275–301)
Potassium: 4.8 mmol/L (ref 3.5–5.1)
SGOT(AST): 55 U/L — ABNORMAL HIGH (ref 15–37)
SGPT (ALT): 22 U/L
Sodium: 135 mmol/L — ABNORMAL LOW (ref 136–145)
Total Protein: 6.5 g/dL (ref 6.4–8.2)

## 2013-12-05 LAB — CBC WITH DIFFERENTIAL/PLATELET
BASOS ABS: 0 10*3/uL (ref 0.0–0.1)
Basophil %: 0.5 %
Eosinophil #: 0.2 10*3/uL (ref 0.0–0.7)
Eosinophil %: 2 %
HCT: 35.8 % (ref 35.0–47.0)
HGB: 11.5 g/dL — AB (ref 12.0–16.0)
LYMPHS ABS: 4.1 10*3/uL — AB (ref 1.0–3.6)
LYMPHS PCT: 46.2 %
MCH: 24.7 pg — AB (ref 26.0–34.0)
MCHC: 32.1 g/dL (ref 32.0–36.0)
MCV: 77 fL — AB (ref 80–100)
MONO ABS: 0.4 x10 3/mm (ref 0.2–0.9)
Monocyte %: 4.7 %
NEUTROS ABS: 4.1 10*3/uL (ref 1.4–6.5)
Neutrophil %: 46.6 %
Platelet: 335 10*3/uL (ref 150–440)
RBC: 4.66 10*6/uL (ref 3.80–5.20)
RDW: 16 % — ABNORMAL HIGH (ref 11.5–14.5)
WBC: 8.8 10*3/uL (ref 3.6–11.0)

## 2013-12-05 LAB — LIPASE, BLOOD: Lipase: 82 U/L (ref 73–393)

## 2014-03-18 ENCOUNTER — Inpatient Hospital Stay: Payer: Self-pay | Admitting: Internal Medicine

## 2014-03-18 LAB — URINALYSIS, COMPLETE
BILIRUBIN, UR: NEGATIVE
GLUCOSE, UR: NEGATIVE mg/dL (ref 0–75)
Leukocyte Esterase: NEGATIVE
Nitrite: NEGATIVE
Ph: 5 (ref 4.5–8.0)
Protein: 30
RBC,UR: 1 /HPF (ref 0–5)
SPECIFIC GRAVITY: 1.012 (ref 1.003–1.030)

## 2014-03-18 LAB — COMPREHENSIVE METABOLIC PANEL
ALK PHOS: 106 U/L
ALT: 23 U/L
Albumin: 2.6 g/dL — ABNORMAL LOW (ref 3.4–5.0)
Anion Gap: 43 — ABNORMAL HIGH (ref 7–16)
BILIRUBIN TOTAL: 0.7 mg/dL (ref 0.2–1.0)
BUN: 10 mg/dL (ref 7–18)
CO2: 7 mmol/L — AB (ref 21–32)
Calcium, Total: 7.5 mg/dL — ABNORMAL LOW (ref 8.5–10.1)
Chloride: 96 mmol/L — ABNORMAL LOW (ref 98–107)
Creatinine: 1.18 mg/dL (ref 0.60–1.30)
GFR CALC NON AF AMER: 50 — AB
GLUCOSE: 94 mg/dL (ref 65–99)
OSMOLALITY: 289 (ref 275–301)
POTASSIUM: 3.2 mmol/L — AB (ref 3.5–5.1)
SGOT(AST): 53 U/L — ABNORMAL HIGH (ref 15–37)
Sodium: 146 mmol/L — ABNORMAL HIGH (ref 136–145)
TOTAL PROTEIN: 6.6 g/dL (ref 6.4–8.2)

## 2014-03-18 LAB — CBC
HCT: 39.5 % (ref 35.0–47.0)
HGB: 11.6 g/dL — AB (ref 12.0–16.0)
MCH: 27.4 pg (ref 26.0–34.0)
MCHC: 29.4 g/dL — ABNORMAL LOW (ref 32.0–36.0)
MCV: 93 fL (ref 80–100)
Platelet: 129 10*3/uL — ABNORMAL LOW (ref 150–440)
RBC: 4.23 10*6/uL (ref 3.80–5.20)
RDW: 16.7 % — ABNORMAL HIGH (ref 11.5–14.5)
WBC: 5.9 10*3/uL (ref 3.6–11.0)

## 2014-03-18 LAB — TROPONIN I: Troponin-I: <0.02 ng/mL

## 2014-03-18 LAB — LIPASE, BLOOD: LIPASE: 28 U/L — AB (ref 73–393)

## 2014-03-18 LAB — PHOSPHORUS: Phosphorus: 6.3 mg/dL — ABNORMAL HIGH (ref 2.5–4.9)

## 2014-03-18 LAB — OSMOLALITY: Osmolality: 334 mOsm/kg (ref 280–301)

## 2014-03-18 LAB — ETHANOL: Ethanol: <3 mg/dL

## 2014-03-18 LAB — MAGNESIUM: MAGNESIUM: 1.9 mg/dL

## 2014-03-19 LAB — BASIC METABOLIC PANEL
ANION GAP: 33 — AB (ref 7–16)
Anion Gap: 42 — ABNORMAL HIGH (ref 7–16)
BUN: 10 mg/dL (ref 7–18)
BUN: 10 mg/dL (ref 7–18)
CALCIUM: 7.4 mg/dL — AB (ref 8.5–10.1)
CHLORIDE: 93 mmol/L — AB (ref 98–107)
CO2: 17 mmol/L — AB (ref 21–32)
CREATININE: 1.32 mg/dL — AB (ref 0.60–1.30)
Calcium, Total: 6.5 mg/dL — CL (ref 8.5–10.1)
Chloride: 96 mmol/L — ABNORMAL LOW (ref 98–107)
Co2: 10 mmol/L — CL (ref 21–32)
Creatinine: 1.33 mg/dL — ABNORMAL HIGH (ref 0.60–1.30)
EGFR (African American): 53 — ABNORMAL LOW
EGFR (Non-African Amer.): 43 — ABNORMAL LOW
GFR CALC AF AMER: 52 — AB
GFR CALC NON AF AMER: 44 — AB
Glucose: 137 mg/dL — ABNORMAL HIGH (ref 65–99)
Glucose: 409 mg/dL — ABNORMAL HIGH (ref 65–99)
OSMOLALITY: 295 (ref 275–301)
Osmolality: 301 (ref 275–301)
Potassium: 3.5 mmol/L (ref 3.5–5.1)
Potassium: 2.9 mmol/L — ABNORMAL LOW (ref 3.5–5.1)
Sodium: 148 mmol/L — ABNORMAL HIGH (ref 136–145)
Sodium: 143 mmol/L (ref 136–145)

## 2014-03-19 LAB — CBC WITH DIFFERENTIAL/PLATELET
BASOS ABS: 0 10*3/uL (ref 0.0–0.1)
BASOS PCT: 0.5 %
Eosinophil #: 0 10*3/uL (ref 0.0–0.7)
Eosinophil %: 0 %
HCT: 28.5 % — ABNORMAL LOW (ref 35.0–47.0)
HGB: 8.6 g/dL — ABNORMAL LOW (ref 12.0–16.0)
LYMPHS PCT: 13.4 %
Lymphocyte #: 0.9 10*3/uL — ABNORMAL LOW (ref 1.0–3.6)
MCH: 27.6 pg (ref 26.0–34.0)
MCHC: 30.3 g/dL — AB (ref 32.0–36.0)
MCV: 91 fL (ref 80–100)
MONO ABS: 0.5 x10 3/mm (ref 0.2–0.9)
Monocyte %: 7.2 %
Neutrophil #: 5.3 10*3/uL (ref 1.4–6.5)
Neutrophil %: 78.9 %
PLATELETS: 119 10*3/uL — AB (ref 150–440)
RBC: 3.13 10*6/uL — ABNORMAL LOW (ref 3.80–5.20)
RDW: 16.5 % — AB (ref 11.5–14.5)
WBC: 6.7 10*3/uL (ref 3.6–11.0)

## 2014-03-19 LAB — OSMOLALITY: Osmolality: 335 mOsm/kg (ref 280–301)

## 2014-03-19 LAB — BETA-HYDROXYBUTYRIC ACID

## 2014-03-19 LAB — COMPREHENSIVE METABOLIC PANEL
ALK PHOS: 91 U/L
Albumin: 2.4 g/dL — ABNORMAL LOW (ref 3.4–5.0)
Anion Gap: 43 — ABNORMAL HIGH (ref 7–16)
BUN: 12 mg/dL (ref 7–18)
Bilirubin,Total: 0.8 mg/dL (ref 0.2–1.0)
CALCIUM: 6.8 mg/dL — AB (ref 8.5–10.1)
CREATININE: 1.27 mg/dL (ref 0.60–1.30)
Chloride: 96 mmol/L — ABNORMAL LOW (ref 98–107)
Co2: 10 mmol/L — CL (ref 21–32)
EGFR (Non-African Amer.): 46 — ABNORMAL LOW
GFR CALC AF AMER: 55 — AB
Glucose: 313 mg/dL — ABNORMAL HIGH (ref 65–99)
Osmolality: 308 (ref 275–301)
Potassium: 3.1 mmol/L — ABNORMAL LOW (ref 3.5–5.1)
SGOT(AST): 43 U/L — ABNORMAL HIGH (ref 15–37)
SGPT (ALT): 21 U/L
Sodium: 149 mmol/L — ABNORMAL HIGH (ref 136–145)
Total Protein: 5.9 g/dL — ABNORMAL LOW (ref 6.4–8.2)

## 2014-03-19 LAB — POTASSIUM: Potassium: 2.4 mmol/L — CL (ref 3.5–5.1)

## 2014-03-20 LAB — BASIC METABOLIC PANEL
ANION GAP: 6 — AB (ref 7–16)
BUN: 4 mg/dL — AB (ref 7–18)
CHLORIDE: 91 mmol/L — AB (ref 98–107)
CREATININE: 1.42 mg/dL — AB (ref 0.60–1.30)
Calcium, Total: 5.7 mg/dL — CL (ref 8.5–10.1)
Co2: 43 mmol/L (ref 21–32)
EGFR (African American): 49 — ABNORMAL LOW
EGFR (Non-African Amer.): 40 — ABNORMAL LOW
Glucose: 162 mg/dL — ABNORMAL HIGH (ref 65–99)
Osmolality: 280 (ref 275–301)
Potassium: 3.1 mmol/L — ABNORMAL LOW (ref 3.5–5.1)
SODIUM: 140 mmol/L (ref 136–145)

## 2014-03-20 LAB — CBC WITH DIFFERENTIAL/PLATELET
Basophil #: 0 10*3/uL (ref 0.0–0.1)
Basophil %: 0.5 %
EOS PCT: 0.3 %
Eosinophil #: 0 10*3/uL (ref 0.0–0.7)
HCT: 27.5 % — ABNORMAL LOW (ref 35.0–47.0)
HGB: 8.6 g/dL — AB (ref 12.0–16.0)
LYMPHS ABS: 2 10*3/uL (ref 1.0–3.6)
LYMPHS PCT: 30.8 %
MCH: 27.4 pg (ref 26.0–34.0)
MCHC: 31.3 g/dL — ABNORMAL LOW (ref 32.0–36.0)
MCV: 88 fL (ref 80–100)
MONO ABS: 0.3 x10 3/mm (ref 0.2–0.9)
Monocyte %: 4.6 %
NEUTROS ABS: 4.1 10*3/uL (ref 1.4–6.5)
Neutrophil %: 63.8 %
Platelet: 102 10*3/uL — ABNORMAL LOW (ref 150–440)
RBC: 3.14 10*6/uL — AB (ref 3.80–5.20)
RDW: 15.6 % — ABNORMAL HIGH (ref 11.5–14.5)
WBC: 6.5 10*3/uL (ref 3.6–11.0)

## 2014-03-21 LAB — BASIC METABOLIC PANEL
ANION GAP: 6 — AB (ref 7–16)
BUN: 1 mg/dL — AB (ref 7–18)
CALCIUM: 5.9 mg/dL — AB (ref 8.5–10.1)
Chloride: 90 mmol/L — ABNORMAL LOW (ref 98–107)
Co2: 42 mmol/L (ref 21–32)
Creatinine: 1.08 mg/dL (ref 0.60–1.30)
EGFR (Non-African Amer.): 55 — ABNORMAL LOW
GLUCOSE: 138 mg/dL — AB (ref 65–99)
OSMOLALITY: 274 (ref 275–301)
POTASSIUM: 3.1 mmol/L — AB (ref 3.5–5.1)
Sodium: 138 mmol/L (ref 136–145)

## 2014-03-21 LAB — CBC WITH DIFFERENTIAL/PLATELET
BASOS ABS: 0 10*3/uL (ref 0.0–0.1)
BASOS PCT: 0.5 %
EOS PCT: 0.5 %
Eosinophil #: 0 10*3/uL (ref 0.0–0.7)
HCT: 27.1 % — AB (ref 35.0–47.0)
HGB: 8.3 g/dL — AB (ref 12.0–16.0)
Lymphocyte #: 1.3 10*3/uL (ref 1.0–3.6)
Lymphocyte %: 18.5 %
MCH: 27.1 pg (ref 26.0–34.0)
MCHC: 30.5 g/dL — AB (ref 32.0–36.0)
MCV: 89 fL (ref 80–100)
Monocyte #: 0.3 x10 3/mm (ref 0.2–0.9)
Monocyte %: 4.3 %
NEUTROS ABS: 5.4 10*3/uL (ref 1.4–6.5)
Neutrophil %: 76.2 %
Platelet: 78 10*3/uL — ABNORMAL LOW (ref 150–440)
RBC: 3.05 10*6/uL — ABNORMAL LOW (ref 3.80–5.20)
RDW: 15.3 % — ABNORMAL HIGH (ref 11.5–14.5)
WBC: 7.1 10*3/uL (ref 3.6–11.0)

## 2014-03-22 LAB — CBC WITH DIFFERENTIAL/PLATELET
BASOS ABS: 0 10*3/uL (ref 0.0–0.1)
Basophil %: 0.4 %
EOS ABS: 0.1 10*3/uL (ref 0.0–0.7)
Eosinophil %: 1.4 %
HCT: 23.6 % — AB (ref 35.0–47.0)
HGB: 7.3 g/dL — ABNORMAL LOW (ref 12.0–16.0)
LYMPHS ABS: 1.3 10*3/uL (ref 1.0–3.6)
LYMPHS PCT: 24.2 %
MCH: 26.9 pg (ref 26.0–34.0)
MCHC: 30.7 g/dL — AB (ref 32.0–36.0)
MCV: 87 fL (ref 80–100)
MONO ABS: 0.2 x10 3/mm (ref 0.2–0.9)
Monocyte %: 4.5 %
NEUTROS PCT: 69.5 %
Neutrophil #: 3.8 10*3/uL (ref 1.4–6.5)
PLATELETS: 79 10*3/uL — AB (ref 150–440)
RBC: 2.7 10*6/uL — ABNORMAL LOW (ref 3.80–5.20)
RDW: 15.3 % — ABNORMAL HIGH (ref 11.5–14.5)
WBC: 5.5 10*3/uL (ref 3.6–11.0)

## 2014-03-22 LAB — COMPREHENSIVE METABOLIC PANEL
Albumin: 1.7 g/dL — ABNORMAL LOW (ref 3.4–5.0)
Alkaline Phosphatase: 84 U/L
Anion Gap: 5 — ABNORMAL LOW (ref 7–16)
BUN: 5 mg/dL — AB (ref 7–18)
Bilirubin,Total: 0.3 mg/dL (ref 0.2–1.0)
Calcium, Total: 6.2 mg/dL — CL (ref 8.5–10.1)
Chloride: 99 mmol/L (ref 98–107)
Co2: 37 mmol/L — ABNORMAL HIGH (ref 21–32)
Creatinine: 0.83 mg/dL (ref 0.60–1.30)
EGFR (African American): >60
Glucose: 130 mg/dL — ABNORMAL HIGH (ref 65–99)
OSMOLALITY: 280 (ref 275–301)
POTASSIUM: 3.5 mmol/L (ref 3.5–5.1)
SGOT(AST): 25 U/L (ref 15–37)
SGPT (ALT): 16 U/L
SODIUM: 141 mmol/L (ref 136–145)
TOTAL PROTEIN: 4.5 g/dL — AB (ref 6.4–8.2)

## 2014-03-23 LAB — CULTURE, BLOOD (SINGLE)

## 2014-04-04 ENCOUNTER — Emergency Department: Payer: Self-pay | Admitting: Emergency Medicine

## 2014-04-04 LAB — COMPREHENSIVE METABOLIC PANEL
ALBUMIN: 2 g/dL — AB (ref 3.4–5.0)
Alkaline Phosphatase: 148 U/L — ABNORMAL HIGH
Anion Gap: 10 (ref 7–16)
BUN: 8 mg/dL (ref 7–18)
Bilirubin,Total: 0.3 mg/dL (ref 0.2–1.0)
Calcium, Total: 7.6 mg/dL — ABNORMAL LOW (ref 8.5–10.1)
Chloride: 110 mmol/L — ABNORMAL HIGH (ref 98–107)
Co2: 23 mmol/L (ref 21–32)
Creatinine: 0.81 mg/dL (ref 0.60–1.30)
EGFR (African American): >60
EGFR (Non-African Amer.): >60
GLUCOSE: 141 mg/dL — AB (ref 65–99)
OSMOLALITY: 286 (ref 275–301)
POTASSIUM: 3.1 mmol/L — AB (ref 3.5–5.1)
SGOT(AST): 18 U/L (ref 15–37)
SGPT (ALT): 13 U/L — ABNORMAL LOW
SODIUM: 143 mmol/L (ref 136–145)
Total Protein: 5.8 g/dL — ABNORMAL LOW (ref 6.4–8.2)

## 2014-04-04 LAB — URINALYSIS, COMPLETE
BILIRUBIN, UR: NEGATIVE
Blood: NEGATIVE
Glucose,UR: NEGATIVE mg/dL (ref 0–75)
KETONE: NEGATIVE
Nitrite: NEGATIVE
PH: 6 (ref 4.5–8.0)
PROTEIN: NEGATIVE
SPECIFIC GRAVITY: 1.01 (ref 1.003–1.030)

## 2014-04-04 LAB — CBC
HCT: 26.8 % — ABNORMAL LOW (ref 35.0–47.0)
HGB: 8.4 g/dL — ABNORMAL LOW (ref 12.0–16.0)
MCH: 27.5 pg (ref 26.0–34.0)
MCHC: 31.3 g/dL — AB (ref 32.0–36.0)
MCV: 88 fL (ref 80–100)
Platelet: 305 10*3/uL (ref 150–440)
RBC: 3.06 10*6/uL — ABNORMAL LOW (ref 3.80–5.20)
RDW: 16.1 % — ABNORMAL HIGH (ref 11.5–14.5)
WBC: 6.7 10*3/uL (ref 3.6–11.0)

## 2014-04-04 LAB — CLOSTRIDIUM DIFFICILE(ARMC)

## 2014-04-04 LAB — TROPONIN I
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
## 2014-07-15 NOTE — Discharge Summary (Signed)
PATIENT NAME:  Krista Ruiz, Krista Ruiz MR#:  [id number] DATE OF BIRTH:  1955/04/11  DATE OF ADMISSION:  02/09/2012 DATE OF DISCHARGE:  02/27/2012  ADMITTING DIAGNOSIS: Severe nausea and vomiting, severe dehydration, and metabolic acidosis.   DISCHARGE DIAGNOSES:  1. Acute respiratory failure. 2. Systemic inflammatory response reaction. 3. Bacterial pneumonia as well as questionable colitis.  4. Acute pneumothorax on the left status post chest tube placement and removal on 02/23/2012.  5. Metabolic encephalopathy due to multiple metabolic derangements. 6. Dehydration with severe hypotension resolved on intravenous fluids.  7. Metabolic acidosis, resolved.  8. Chronic pancreatitis as well as acute colitis. 9. Abdominal pain due to questionable chronic pancreatitis, chronic gastritis.  10. Hypokalemia.  11. Alcohol abuse.  12. Anemia.  13. Diabetes mellitus.  14. Hypothyroidism.  15. Tobacco and alcohol abuse.   DISCHARGE CONDITION: Stable.   DISCHARGE MEDICATIONS: The patient is to resume her outpatient medications which are: 1. Gabapentin 600 mg p.o. 3 times daily. 2. Synthroid 100 mcg p.o. daily.  3. Metformin 500 mg p.o. twice daily.  4. Metoprolol tartrate 50 mg p.o. twice daily.  5. Omeprazole 20 mg p.o. twice daily.  6. Prednisone 10-mg tablet, 3 tablets once daily on 02/28/2012, then taper by 10 mg daily until stopped.  7. Pancrelipase 24,000/76,000/120,000 units, oral delayed capsule, 3 capsules 3 times daily with meals.  8. Sucralfate 1 gram 3 times daily before meals.  9. Nicotine 10-mg inhalation device every 4 hours as needed.  10. Nicotine transdermal patch, 21-mg patch once a day.  HOME OXYGEN: Portable tank at 1 liter of oxygen through nasal cannula.   DIET: 2 grams salt, low fat, low cholesterol, carbohydrate controlled diet, regular consistency.   ACTIVITY LIMITATIONS: As tolerated.     FOLLOWUP: Follow-up appointment with [facility] primary care physician two  days after discharge. The patient was also to continue physical therapy 2 to 7 times a week.  She is being discharged to a rehabilitation facility for management of her chronic medical problems.  HOSPITAL COURSE: Please refer to the interim discharge summary dictated by Dr. Imogene Burn on the 29th of  November 2013. 1. In regards to nausea and vomiting, severe dehydration, and metabolic acidosis it was likely related to acute on chronic pancreatitis as well as possibly mild gastritis. With IV fluid administration and supportive therapy the patient improved. She underwent esophagogastroduodenoscopy on 02/13/2012 which revealed mild gastritis after which the patient was given Carafate and her condition overall improved.  It was felt that her nausea, vomiting, and abdominal pain were related to acute on chronic pancreatitis and possibly gastritis, which in turn was related to alcohol abuse. The patient also had acute hypoxic respiratory failure. It was felt to be multifactorial due to left pneumothorax. She had a chest tube placed and was treated with the chest tube. The patient had the chest tube removed on 02/23/2012. The patient's chest x-ray has shown increasing improvement of her left pneumothorax. Recent chest x-ray done on 02/27/2012 revealed some mild fluid overload for which the patient will be given one dose of Lasix and potassium with hopes that we will be able to wean her off oxygen completely after this therapy. 2. In regards to abdominal pain, it was felt to be due to acute colitis as well as possibly acute on chronic pancreatitis as mentioned above as well as gastritis. The patient had a CT scan done on 02/14/2012 which revealed severe chronic pancreatitis as well as colitis involving the transverse, descending, and sigmoid portions  of the colon The patient was treated with antibiotic therapy after which she improved. She was also continued on proton pump inhibitors as well as Carafate which also improved  her pain. 3. The patient had encephalopathy which was multifactorial. However, it resolved with therapy. The patient was severely hypotensive initially on arrival to the hospital. With IV fluid administration the patient's hypotension resolved and now the patient is hypertensive. We were able to resume the patient's beta blocker.  However, no ACE inhibitor or hydrochlorothiazide was given because of unpredictable oral intake at this point. The patient's blood pressure is much better today on the day of discharge, 02/27/2012. Vital signs are as follows: Temperature 98.2, pulse 91, respiratory rate 18, blood pressure 136/84, saturation was 94% on 1 liter of oxygen through nasal cannula.  4. For her hypothyroidism, the patient is to continue her Synthroid.  5. For alcohol abuse, the patient had no withdrawal symptoms. She was counseled about alcohol use issues and she agreed to stop drinking.  6. In regards to anemia, the patient was noted to be severely anemic with rehydration. The patient's hemoglobin level on the day of discharge is 8.8. No bleeding was noted.  7. The patient was noted to be hypomagnesemic and hypokalemic intermittently with potassium levels as low as 2.3 on the 26th of 01/2012. By the day of discharge, however, the patient's potassium level was improved. The patient's magnesium level is being supplemented.   8. In regards to diabetes mellitus, the patient was continued on sliding scale insulin while she was in the hospital. However, her sugars are running around 200 and will be restarting her metformin. It is recommended, however, to check the patient's glucose levels frequently to make sure that her glucose levels do not drop down and she does not develop severe metabolic acidosis with metformin if her oral intake is not adequate.   The patient is being discharged to a skilled nursing facility for management of her chronic medical problems. On the day of discharge her vital signs are  stable. She [facility] as well as gastroenterologist if needed.   TIME SPENT: 40 minutes.  ____________________________ Katharina Caperima Datha Kissinger, MD rv:bjt D: 02/27/2012 11:57:18 ET T: 02/27/2012 12:46:58 ET JOB#: [id number]  cc: Katharina Caperima Anthonymichael Munday, MD, <Dictator> [facility] Ehtan Delfavero MD ELECTRONICALLY SIGNED 03/10/2012 16:14

## 2014-07-15 NOTE — Consult Note (Signed)
Brief Consult Note: Diagnosis: Fall.  History of alcohol abuse.  Continued binge drinking.  History of chronic pancreatitis with history of diabetes mellitus, Type I.  Metabolic acidosis.  Hypocalcemia.  Hypokalemia.   Consult note dictated.   Discussed with Attending MD.   Comments: Patient's presentation discussed with Dr. Lutricia FeilPaul Oh.  Full dictated noted done.  Recommend transfer to ICU. Hypoxia and metabolic acidosis.  Will continue to monitor.  Once electrolyte imbalance has been corrected and her condition has stablized will consider proceeding with endoscopic evaluation.  Diet changed to NPO at this time.  Dictated as recommendation of clear liquid but after speaking with Dr. Bluford Kaufmannh will change to NPO status.  Electronic Signatures: Rodman KeyHarrison, Dawn S (NP)  (Signed [phone]-Nov-13 15:13)  Authored: Brief Consult Note   Last Updated: 15-Nov-13 15:13 by Rodman KeyHarrison, Dawn S (NP)

## 2014-07-15 NOTE — Consult Note (Signed)
Pt complains of pain across abdomen esp in LUQ,  this is likely due to chronic pancreatitis, which is seen on CT scan.  over half of patients with chronic pancreatitis are on chronic narcotics. She is eating her supper fairly well.  Continue PPI for gastritis, continue pancreatic enzymes. No other suggestions.  If diarrhea continues repeat C. diff PCR in 2-3 days.  Electronic Signatures: Scot JunElliott, Sherita Decoste T (MD)  (Signed on 27-Nov-13 17:43)  Authored  Last Updated: 27-Nov-13 17:43 by Scot JunElliott, Meria Crilly T (MD)

## 2014-07-15 NOTE — Consult Note (Signed)
Brief Consult Note: Diagnosis: NVD, dehydration.   Patient was seen by consultant.   Consult note dictated.   Comments: Appreciate consult for 59 y/o [place] woman with complex health history, who was recently admitted for NVD, found to have colitis, who developed respiratory failure and sepsis, requiring intubation, pressor support, and multiple antibiotics. She also developed some encephalopathy and has had multiple electrolyte imbalances.  GI was consulted earlier this visit due colitis- was found to have upper abdominal pain with history of chronic BC powder use, so underwent EGD that revealed gastritis- but this was not felt to be a likely cause of her abdominal pain, which was attributed to her chronic pancreatitis. Further recommendations were made regarding pancreatic enzyme replacement. Surgery was also consulted regarding her abdominal pain and concerns for colitis on CT. Was not felt that she had a surgical abdomen. Patient has been on multiple antibiotics, including Cipro, flagyl, rocephin, and now zosyn.  A specimen for c-diff was sent 3d ago, which was negative.   Today she is still somewhat confused and has limited history giving abilities. States that her stomach hurts all over and she hurts all over. She says that she ate a little bit today and it did not hurt her stomach. She is unable to give further details: asks me to help her go home now. On exam, she has a central line, left chest tube, foley catheter and rectal tube. Her abdomen is nondistended with hypoactive bowel sounds, very soft with diffuse tendnerness. No peritoneal signs or rigidity. Her rectal tube has thin green liquid in it. There is no note of emesis.  She is afebrile and hemodynamically stable. Has had marked electrolyte abnormalities.  Impression/ plan: Generalized abdominal pain. Diarrhea. Likely multifactorial. On PPI. Noted 3way abdomen is pending. Would continue w/ PPI , pancreatic enzyme supplements and  replace electrolytes accordingly. Will d/w with Dr Markham JordanElliot Addendum: Did d/w Dr Markham JordanElliot: we will add carafate slurry, 1gm tid half hr prior to meals for now, further rec to follow.  Electronic Signatures: Vevelyn PatLondon, Arlanda Shiplett H (NP)  (Signed [phone]-Nov-13 17:47)  Authored: Brief Consult Note   Last Updated: 26-Nov-13 17:47 by Keturah BarreLondon, Koleman Marling H (NP)
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
## 2014-07-15 NOTE — Consult Note (Signed)
Chief Complaint:   Subjective/Chief Complaint Looks a little better. Still lethargic but more alert than yest. pH improving.  BP much better than yest.   VITAL SIGNS/ANCILLARY NOTES: **Vital Signs.:   16-Nov-13 10:05   Vital Signs Type Q 4hr   Temperature Temperature (F) 98.7   Celsius 37   Temperature Source oral   Pulse Pulse 111   Respirations Respirations 20   Systolic BP Systolic BP 891   Diastolic BP (mmHg) Diastolic BP (mmHg) 76   Mean BP 95   Pulse Ox % Pulse Ox % 92   Pulse Ox Activity Level  At rest   Oxygen Delivery 3L   Brief Assessment:   Cardiac Regular    Respiratory clear BS    Gastrointestinal diffusely tender but less than yest. Ventral hernia  present   Lab Results:  General Ref:  [id number]    Salicylates, Serum < 1.7 (0.0-2.8 Therapeutic 2.8-20.0 mg/dL Toxic >30.0 mg/dL)  Routine Chem:  16-Nov-13 05:54    Glucose, Serum  217   BUN  3   Creatinine (comp) 0.82   Sodium, Serum 139   Potassium, Serum  3.3   Chloride, Serum 100   CO2, Serum 32   Calcium (Total), Serum  6.2   Anion Gap 7   Osmolality (calc) 281   eGFR (African American) >60   eGFR (Non-African American) >60 (eGFR values <60m/min/1.73 m2 may be an indication of chronic kidney disease (CKD). Calculated eGFR is useful in patients with stable renal function. The eGFR calculation will not be reliable in acutely ill patients when serum creatinine is changing rapidly. It is not useful in  patients on dialysis. The eGFR calculation may not be applicable to patients at the low and high extremes of body sizes, pregnant women, and vegetarians.)   Result Comment calcium - RESULTS VERIFIED BY REPEAT TESTING.  - NOTIFIED OF CRITICAL VALUE  - hp t phillys King_0 ,02/11/12  - READ-BACK PROCESS PERFORMED.  Result(s) reported on 11 Feb 2012 at 06:31AM.   Assessment/Plan:  Assessment/Plan:   Assessment Chronic pancreatitis. Gallstones. Metab acidosis.    Plan Continue IV  hydration. [facility]. Plan on EGD on Monday.   Electronic Signatures: OVerdie Shire(MD)  (Signed [id number])  Authored: Chief Complaint, VITAL SIGNS/ANCILLARY NOTES, Brief Assessment, Lab Results, Assessment/Plan   Last Updated: 16-Nov-13 10:37 by OVerdie Shire(MD)

## 2014-07-15 NOTE — Consult Note (Signed)
Chief Complaint:   Subjective/Chief Complaint No significant complaints.   VITAL SIGNS/ANCILLARY NOTES: **Vital Signs.:   28-Nov-13 10:04   Temperature Temperature (F) 97.8   Celsius 36.5   Temperature Source oral   Pulse Pulse 91   Respirations Respirations 18   Systolic BP Systolic BP 588   Diastolic BP (mmHg) Diastolic BP (mmHg) 97   Mean BP 115   Pulse Ox % Pulse Ox % 93   Pulse Ox Activity Level  At rest   Oxygen Delivery 2L   Brief Assessment:   Additional Physical Exam Abdomen is soft although mild diffuse tenderness was noted. No rebound.   Lab Results: Hepatic:  28-Nov-13 04:26    Bilirubin, Total  1.2   Alkaline Phosphatase  482   SGPT (ALT) 27   SGOT (AST) 35   Total Protein, Serum  5.2   Albumin, Serum  2.0  Routine Chem:  28-Nov-13 04:26    Magnesium, Serum  1.7 (1.8-2.4 THERAPEUTIC RANGE: 4-7 mg/dL TOXIC: > 10 mg/dL  -----------------------)   Glucose, Serum  155   BUN 10   Creatinine (comp) 0.73   Sodium, Serum 141   Potassium, Serum  3.2   Chloride, Serum 103   CO2, Serum 29   Calcium (Total), Serum  7.2   Osmolality (calc) 283   eGFR (African American) >60   eGFR (Non-African American) >60 (eGFR values <70m/min/1.73 m2 may be an indication of chronic kidney disease (CKD). Calculated eGFR is useful in patients with stable renal function. The eGFR calculation will not be reliable in acutely ill patients when serum creatinine is changing rapidly. It is not useful in  patients on dialysis. The eGFR calculation may not be applicable to patients at the low and high extremes of body sizes, pregnant women, and vegetarians.)   Anion Gap 9   Result Comment CBC - SLIDE PREVIOUSLY REVIEWED BY PATHOLOGIST  Result(s) reported on 23 Feb 2012 at 0[facility]  Routine Hem:  28-Nov-13 04:26    WBC (CBC) 4.9   RBC (CBC)  2.77   Hemoglobin (CBC)  7.9   Hematocrit (CBC)  24.7   Platelet Count (CBC) 201   MCV 89   MCH 28.4   MCHC  31.8   RDW  16.1    Segmented Neutrophils 84   Lymphocytes 7   Variant Lymphocytes 2   Monocytes 7   NRBC 8   Diff Comment 1 PLTS VARIED IN SIZE   Diff Comment 2 TARGET CELLS   Diff Comment 3 POLYCHROMASIA   Diff Comment 4 ANISOCYTOSIS  Result(s) reported on 23 Feb 2012 at 06:21AM.   Assessment/Plan:  Assessment/Plan:   Assessment Multiple medical issues as well as chronic abdominal pain and diarrhea. Pain is thought to be secondary to chronic pancreatitis. Stool for c. diff is negative.    Plan Continue Pancreatic enzyme replacement as well as carafate. Will follow.   Electronic Signatures: IJill Side(MD)  (Signed [id number])  Authored: Chief Complaint, VITAL SIGNS/ANCILLARY NOTES, Brief Assessment, Lab Results, Assessment/Plan   Last Updated: 28-Nov-13 12:20 by IJill Side(MD)

## 2014-07-15 NOTE — Consult Note (Signed)
Pt says abd not as tender as a few days ago. Carafate may be helping some.  Still mild tenderness on my exam.  No distention or masses.  K is 2.9, oral started. Her gastritis may not tolerate the oral K but will see how it goes for now.  No new suggestions.  Agree with plans for rehab for a long time  Electronic Signatures: Scot JunElliott, Amelia Macken T (MD)  (Signed on 30-Nov-13 12:01)  Authored  Last Updated: 30-Nov-13 12:01 by Scot JunElliott, Cadin Luka T (MD)

## 2014-07-15 NOTE — Consult Note (Signed)
Chief Complaint:   Subjective/Chief Complaint Events of last 24 hrs noted. Intubated and sedated. NPO   VITAL SIGNS/ANCILLARY NOTES: **Vital Signs.:   20-Nov-13 14:00   Vital Signs Type Routine   Pulse Pulse 104   Respirations Respirations 16   Systolic BP Systolic BP 85   Diastolic BP (mmHg) Diastolic BP (mmHg) 50   Mean BP 65   Pulse Ox % Pulse Ox % 97   Oxygen Delivery Ventilator Assisted   Pulse Ox Heart Rate 104   Brief Assessment:   Cardiac Regular    Respiratory Chest tube in place    Gastrointestinal Normal   Lab Results: Routine Chem:  20-Nov-13 03:45    Magnesium, Serum 1.9 (1.8-2.4 THERAPEUTIC RANGE: 4-7 mg/dL TOXIC: > 10 mg/dL  -----------------------)   Phosphorus, Serum  2.0 (Result(s) reported on 15 Feb 2012 at 05:23AM.)   Glucose, Serum  121   BUN  2   Creatinine (comp) 0.78   Sodium, Serum 140   Potassium, Serum 4.1   Chloride, Serum 103   CO2, Serum 32   Calcium (Total), Serum  6.0   Anion Gap  5   Osmolality (calc) 277   eGFR (African American) >60   eGFR (Non-African American) >60 (eGFR values <67m/min/1.73 m2 may be an indication of chronic kidney disease (CKD). Calculated eGFR is useful in patients with stable renal function. The eGFR calculation will not be reliable in acutely ill patients when serum creatinine is changing rapidly. It is not useful in  patients on dialysis. The eGFR calculation may not be applicable to patients at the low and high extremes of body sizes, pregnant women, and vegetarians.)   Result Comment calcium - RESULTS VERIFIED BY REPEAT TESTING.  - READ-BACK PROCESS PERFORMED.  - NOTIFIED OF CRITICAL VALUE  - c/michelle williams 02/14/12.nbb  Result(s) reported on 15 Feb 2012 at 05:32AM.  Routine Hem:  20-Nov-13 03:45    WBC (CBC) 5.2   RBC (CBC)  2.84   Hemoglobin (CBC)  8.1   Hematocrit (CBC)  24.4   Platelet Count (CBC)  120   MCV 86   MCH 28.7   MCHC 33.3   RDW  15.1   Neutrophil % 78.6   Lymphocyte  % 12.7   Monocyte % 8.2   Eosinophil % 0.4   Basophil % 0.1   Neutrophil # 4.1   Lymphocyte #  0.7   Monocyte # 0.4   Eosinophil # 0.0   Basophil # 0.0 (Result(s) reported on 15 Feb 2012 at 05:32AM.)   Assessment/Plan:  Assessment/Plan:   Assessment Resp failure on vent.    Plan Keep NPO for now. Will check by periodically to see if patient's condition improves. Thanks   Electronic Signatures: OVerdie Shire(MD)  (Signed [id number])  Authored: Chief Complaint, VITAL SIGNS/ANCILLARY NOTES, Brief Assessment, Lab Results, Assessment/Plan   Last Updated: 20-Nov-13 14:03 by OVerdie Shire(MD)

## 2014-07-15 NOTE — Consult Note (Signed)
Pt seen and examined. See Dawn Harrison's notes. Known hx of chronic pancreatits from EtOH abuse but never on pancreatic enzyme supplement who is clearly acidotic with poor oxygenation by ABG. Persistent DKA? No problems with gallbladder in the past. CT suggests colitis but [facility]. Hx of gastric ulcers also. [facility] Has been taking BC powder. Getting reg food. Recommend transfer to ICU for closer monitering over the weekend. Continue IV hydration. Correct Ca if level continues to fall. NPO may be better when patient continues to be nauseous. PPI IV. Creon when ready for food. EGD on Monday if nausea/pain persists. Will follow. Thanks.   Electronic Signatures: Lutricia Feilh, Ilani Otterson (MD) (Signed on 15-Nov-13 15:09)  Authored   Last Updated: 15-Nov-13 15:11 by Lutricia Feilh, Legrand Lasser (MD)

## 2014-07-15 NOTE — Consult Note (Signed)
Chief Complaint:   Subjective/Chief Complaint Overall same. Three loose BM today. No other complaints.   VITAL SIGNS/ANCILLARY NOTES: **Vital Signs.:   29-Nov-13 18:22   Vital Signs Type Q 4hr   Temperature Temperature (F) 98.4   Celsius 36.8   Temperature Source Oral   Pulse Pulse 92   Respirations Respirations 16   Systolic BP Systolic BP 735   Diastolic BP (mmHg) Diastolic BP (mmHg) 79   Mean BP 98   Pulse Ox % Pulse Ox % 95   Pulse Ox Activity Level  At rest   Oxygen Delivery 1L   Brief Assessment:   Additional Physical Exam Abdomen is soft and benign.   Lab Results: Routine Chem:  29-Nov-13 03:51    Magnesium, Serum 1.8 (1.8-2.4 THERAPEUTIC RANGE: 4-7 mg/dL TOXIC: > 10 mg/dL  -----------------------)   Glucose, Serum  227   BUN 9   Creatinine (comp) 0.71   Sodium, Serum 141   Potassium, Serum  3.0   Chloride, Serum 102   CO2, Serum 28   Calcium (Total), Serum  7.0   Anion Gap 11   Osmolality (calc) 287   eGFR (African American) >60   eGFR (Non-African American) >60 (eGFR values <31m/min/1.73 m2 may be an indication of chronic kidney disease (CKD). Calculated eGFR is useful in patients with stable renal function. The eGFR calculation will not be reliable in acutely ill patients when serum creatinine is changing rapidly. It is not useful in  patients on dialysis. The eGFR calculation may not be applicable to patients at the low and high extremes of body sizes, pregnant women, and vegetarians.)   Result Comment CALCIUM - RESULTS VERIFIED BY REPEAT TESTING.  - NOTIFIED OF CRITICAL VALUE  - C/JOSH SIMSER AT 0430 02/24/12 LAB  - READ-BACK PROCESS PERFORMED.  Result(s) reported on 24 Feb 2012 at 04:15AM.   Assessment/Plan:  Assessment/Plan:   Assessment Chronic abdominal pain and diarrhea most likely secondary to chronic pancreatitis.    Plan Continue pancreatic enzymes. Dr. EVira Agarwill resume care in am.   Electronic Signatures: IJill Side(MD)   (Signed [id number])  Authored: Chief Complaint, VITAL SIGNS/ANCILLARY NOTES, Brief Assessment, Lab Results, Assessment/Plan   Last Updated: 29-Nov-13 19:53 by IJill Side(MD)

## 2014-07-15 NOTE — Consult Note (Signed)
CC: pancreatitis, pt doing better with no abd pain, minimal tenderness to palpation.  Did have gastrirtis and the carafate may have helped.  Advised her to not drink alcohol or her abd pain will return.  Electronic Signatures: Scot JunElliott, Dejohn Ibarra T (MD)  (Signed on 01-Dec-13 12:13)  Authored  Last Updated: 01-Dec-13 12:13 by Scot JunElliott, Omarie Parcell T (MD)

## 2014-07-15 NOTE — Consult Note (Signed)
Still with abd pain and tenderness. EGD showed mild chronic gastritis. Bx taken. Unclear this accounts for her abd pain. Abd pain prob more from chronic pancreatitis. Still nauseous today. So, stay with clear liquid diet. Can start creon 72000 units per meal. [facility] will [facility] tomorrow. Will check back on Wed. Continue to moniter electrolytes, incl ca, mg, phos. Correct as needed.  If there are questions, then contact GI on call. Thanks.   Electronic Signatures: Lutricia Feilh, Yasmene Salomone (MD) (Signed on 18-Nov-13 12:39)  Authored   Last Updated: [id number]-XWR-[id number]: 18-Nov-13 12:40 by Lutricia Feilh, Rheya Minogue (MD)

## 2014-07-15 NOTE — Consult Note (Signed)
Chief Complaint:   Subjective/Chief Complaint Intubated and sedated. On pressors. SBP low now.   VITAL SIGNS/ANCILLARY NOTES: **Vital Signs.:   22-Nov-13 07:00   Temperature Temperature (F) 98.3   Celsius 36.8   Pulse Pulse 98   Respirations Respirations 20   Systolic BP Systolic BP 135   Diastolic BP (mmHg) Diastolic BP (mmHg) 83   Mean BP 100   Pulse Ox % Pulse Ox % 98   Pulse Ox Activity Level  At rest   Oxygen Delivery Ventilator Assisted   Pulse Ox Heart Rate 100   Brief Assessment:   Cardiac Regular  on vent    Respiratory clear BS    Gastrointestinal Normal  soft   Lab Results: Routine Chem:  22-Nov-13 04:00    Potassium, Serum 4.2 (Result(s) reported on 17 Feb 2012 at 05:51AM.)   Phosphorus, Serum  1.5 (Result(s) reported on 17 Feb 2012 at 04:37AM.)  Routine Hem:  22-Nov-13 04:00    WBC (CBC) 7.6   RBC (CBC)  2.88   Hemoglobin (CBC)  8.2   Hematocrit (CBC)  24.8   Platelet Count (CBC)  122   MCV 86   MCH 28.5   MCHC 33.1   RDW  15.2   Neutrophil % 88.1   Lymphocyte % 6.0   Monocyte % 5.5   Eosinophil % 0.0   Basophil % 0.4   Neutrophil #  6.7   Lymphocyte #  0.5   Monocyte # 0.4   Eosinophil # 0.0   Basophil # 0.0 (Result(s) reported on 17 Feb 2012 at 04:37AM.)   Radiology Results: XRay:    22-Nov-13 06:05, Chest Portable Single View   Chest Portable Single View    REASON FOR EXAM:    vent  COMMENTS:       PROCEDURE: DXR - DXR PORTABLE CHEST SINGLE VIEW  - Feb 17 2012  6:05AM     RESULT: Comparison is made to the study of 16 February 2012 and the study   of 15 February 2012. Endotracheal tube remains in place at the level of   the sternoclavicular joints. There is a left subclavian central venous   catheter present with the tip in the superior vena cava near the junction   with the right atrium. Left chest tube appears present. Nasogastric tube   passes below the diaphragm. There is interstitial and alveolar density.   Small pleural  effusions are present. There is minimal right lung base   atelectasis or infiltrate slightly improved over the course of the last 2   days. Persistent interstitial and alveolar density is present. No   pneumothorax is evident. Persistent predominately lytic lesion with some     septations in the right humeral head showing sclerosis near the junction   with the shaft.    IMPRESSION:  Essentially stable chest x-ray. Persistent interstitial and   alveolar density with right lung base atelectasis versus infiltrate. No   large effusion or pneumothorax evident. Left chest tube in position.    Dictation Site: 2        Verified By: Elveria Royals, M.D., MD   Assessment/Plan:  Assessment/Plan:   Assessment Resp failure on vent and with chest tube. Last BM on 20th.    Plan Continue supportive care. Continue to correct electrolyte abnormalities. Little to add from GI point of view. If her condition improves, will need nutrtioinal support in few days. Will sign off. Call us back if conditiions change.   Electronic Signatures:  Lutricia Feilh, Linnae Rasool (MD)  (Signed [phone]-Nov-13 07:54)  Authored: Chief Complaint, VITAL SIGNS/ANCILLARY NOTES, Brief Assessment, Lab Results, Radiology Results, Assessment/Plan   Last Updated: 22-Nov-13 07:54 by Lutricia Feilh, Georgina Krist (MD)

## 2014-07-15 NOTE — Consult Note (Signed)
Comments   Met with pt's 2 daughters and daughter-in-law. Family updated on pt's medical status. They understand that pt remains critically ill and may have a poor recovery. Prior to this hospitalization, pt was living at home with her brother. She did not drive and reportedly was quite frail at her baseline. She would only eat bites during each meal. Pt was able to dress and bathe self.  are in agreement with current medical plan. We discussed code status (note that daughter-in-law is a respiratory therapist) and family is undecided as to a decision. Pt does not have a living will nor did she discuss her wishes with family. Family agree to discuss it together. They also ask to speak with the Intensivist. Dr Mortimer Fries notified.  15 minutes  Electronic Signatures for Addendum Section:  Phifer, Izora Gala (MD) (Signed Addendum [phone] 17:44)  Discussed with Billey Chang, NP,in detail. Agree with assessment and plan as outlined in above note.   Electronic Signatures: Borders, Kirt Boys (NP)  (Signed [phone] 13:00)  Authored: Palliative Care   Last Updated: 20-Nov-13 17:44 by Phifer, Izora Gala (MD)

## 2014-07-15 NOTE — Consult Note (Signed)
Chief Complaint:   Subjective/Chief Complaint Lethargic. Mg, Ca, Phosphorus levels all low. Abd pain persists.   VITAL SIGNS/ANCILLARY NOTES: **Vital Signs.:   17-Nov-13 09:30   Vital Signs Type Q 4hr   Temperature Temperature (F) 100.4   Celsius 38   Temperature Source oral   Pulse Pulse 107   Respirations Respirations 20   Systolic BP Systolic BP 226   Diastolic BP (mmHg) Diastolic BP (mmHg) 67   Mean BP 90   Pulse Ox % Pulse Ox % 94   Pulse Ox Activity Level  At rest   Oxygen Delivery 3L   Brief Assessment:   Cardiac Regular    Respiratory clear BS    Gastrointestinal Epig tenderness   Lab Results: Hepatic:  17-Nov-13 05:33    Bilirubin, Total 0.4   Alkaline Phosphatase 61   SGPT (ALT) 15   SGOT (AST) 23   Total Protein, Serum  5.4   Albumin, Serum  2.3  Routine Chem:  17-Nov-13 05:33    Glucose, Serum  128   BUN  < 1   Creatinine (comp) 0.65   Sodium, Serum 138   Potassium, Serum  3.4   Chloride, Serum 99   CO2, Serum  37   Calcium (Total), Serum  6.7   Osmolality (calc) SEE COMMENT   eGFR (African American) >60   eGFR (Non-African American) >60 (eGFR values <82m/min/1.73 m2 may be an indication of chronic kidney disease (CKD). Calculated eGFR is useful in patients with stable renal function. The eGFR calculation will not be reliable in acutely ill patients when serum creatinine is changing rapidly. It is not useful in  patients on dialysis. The eGFR calculation may not be applicable to patients at the low and high extremes of body sizes, pregnant women, and vegetarians.)   Result Comment CALCIUM/PHOSPHOROUS  - RESULTS VERIFIED BY REPEAT TESTING.  - C/MELISSA COBB/0647/02-12-12/RWW  - NOTIFIED OF CRITICAL VALUE  - READ-BACK PROCESS PERFORMED. OSMOLALITY - UNABLE TO CALCULATE VALUE DUE TO NON-  - NUMERIC VALUE WITHIN THE CALCULATION.  Result(s) reported on 12 Feb 2012 at 06:29AM.   Anion Gap  2   Magnesium, Serum  1.4 (1.8-2.4 THERAPEUTIC RANGE:  4-7 mg/dL TOXIC: > 10 mg/dL  -----------------------)   Phosphorus, Serum  0.3 (Result(s) reported on 12 Feb 2012 at 06:55AM.)   Assessment/Plan:  Assessment/Plan:   Assessment Epig pain. Hx of chronic pancreatitis. Hx of PUD.    Plan Plan EGD tomorrow afternoon. Advance diet afterwards if EGD ok along with creon supplements. Thanks.   Electronic Signatures: OVerdie Shire(MD)  (Signed [phone])  Authored: Chief Complaint, VITAL SIGNS/ANCILLARY NOTES, Brief Assessment, Lab Results, Assessment/Plan   Last Updated: 17-Nov-13 10:56 by OVerdie Shire(MD)

## 2014-07-15 NOTE — Consult Note (Signed)
Comments   Family requested another meeting. Met with pt's 2 daughters and daughter-in-law. Family has decided that they do not want CPR/defibrillation in the event of cardiac arrest. They want to continue aggressive medical care for now but if pt has not improved clinically by next week, they will likely stop aggressive care and transition to comfort care only. Order for DNR entered.   Electronic Signatures: Naysa Puskas, Izora Gala (MD)  (Signed 21-Nov-13 20:41)  Authored: Palliative Care   Last Updated: 21-Nov-13 20:41 by Walter Grima, Izora Gala (MD)
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
## 2014-07-15 NOTE — Consult Note (Signed)
PATIENT NAME:  Krista Ruiz, Krista Ruiz MR#:  [id number] DATE OF BIRTH:  [birth year]  DATE OF CONSULTATION:  02/10/2012  REFERRING PHYSICIAN:  Krystal Eaton, M.D.   CONSULTING PHYSICIAN:  A. Wendall Mola, MD  CHIEF COMPLAINT: Hypothyroidism with elevated TSH and elevated cortisol.   HISTORY OF PRESENT ILLNESS: This is a 59 year old female seen in consultation for the above concerns. She presented to the ED yesterday with nausea and vomiting. She reports a fall about five days ago. She has been feeling fear poorly since that time and has had poor p.o. intake. She was found to have normal vital signs with evidence of an anion gap acidosis with a bicarbonate of 12, and arterial pH of 7.25. She has a history of diabetes and her initial blood glucose was 122, she did have 2+ urinary ketones. Additional labs revealed an elevated potassium of 5.5, normal creatinine of 0.9, and elevated TSH of 28.8. She was treated symptomatically for nausea and rehydrated with IV fluids. She was placed on an insulin sliding scale and she has required very minimal amounts of insulin in the last 24 hours.   Regarding her hypothyroidism, she has postsurgical hypothyroidism and claims to be taking levothyroxine 100 mcg per day as an outpatient. Since her fall five days ago, she has not taken any of the levothyroxine because she has not been feeling well. She denies cold intolerance. She denies constipation. She has a history of papillary thyroid carcinoma, stage III, and she underwent total thyroidectomy and radioiodine ablation in 2009. In the past, she had followed up with [facility] otolaryngology as well as [facility] endocrinology, but she states she has not seen either lately. She claims her primary care physician has been managing her hypothyroidism. She apparently follows with Dr. Pamala Duffel at [facility].   Also of note, yesterday she had an elevated cortisol level of 44 at 10:30 a.m. She has no known prior history of high cortisol to my  knowledge.   PAST MEDICAL HISTORY:  1. Diabetes mellitus.  2. Postsurgical hypothyroidism.  3. History of stage III papillary thyroid cancer, status post surgery and radioiodine ablation in 2009.  4. Hypertension.  5. Hyperlipidemia.  6. Vitamin D deficiency.   INPATIENT MEDICATIONS:  1. NovoLog insulin sliding scale.  2. Ciprofloxacin 200 mg q.12 hours.  3. Folic acid 1 mg daily.  4. Neurontin 600 mg t.i.d.  5. Metronidazole 500 mg q.8 hours. 6. Multivitamin once daily.  7. Neutra-Phos 1 packet t.i.d.  8. Thiamine 100 mg daily.  9. Heparin 5000 units q.12 hours. 10. Sodium bicarbonate with D5 at a rate of 100 mL/h.  11. Normal saline 0.9% at 75 mL/h.   SOCIAL HISTORY: Patient apparently lives alone. She has a history of alcohol use. She denies IV drug use.   FAMILY HISTORY: Positive for heart disease and esophageal cancer.   REVIEW OF SYSTEMS: CONSTITUTIONAL: No fevers. No known weight loss. HEENT: No blurred vision. No sore throat. NECK: No neck pain. CARDIAC: No chest pain. No palpitations. PULMONARY: No cough. ABDOMEN: Ongoing nausea, poor appetite. EXTREMITIES: Reports diffuse pain. Denies swelling. NEUROLOGIC: Reports recent fall. Denies headache. GENITOURINARY: Denies polyuria or dysuria. ENDO: Denies heat or cold intolerance. HEME: Reports easy bruisability. No recent bleeding.   PHYSICAL EXAMINATION:  VITAL SIGNS: Height 60 inches, weight 95 pounds, BMI 18, temperature 97.8, pulse 97, respirations 20, blood pressure 102/70.   GENERAL: Thin African American female, appears chronically ill.   HEENT: Extraocular movements are intact. No proptosis. Oropharynx is clear. Mucous  membranes moist.   NECK: Supple. Thyroid has been surgically removed.   LYMPH: No submandibular or anterior cervical lymphadenopathy.   CARDIAC: Regular rate and rhythm. No murmur.   PULMONARY: Clear bilaterally. No wheeze.   ABDOMEN: Soft, nondistended.   EXTREMITIES: No edema is present.    NEUROLOGIC: Nonfocal.   PSYCHIATRIC: Alert and oriented, cooperative.   LABORATORY, DIAGNOSTIC AND RADIOLOGICAL DATA: As per history of present illness.   ASSESSMENT:  1. Post surgical hypothyroidism, uncontrolled.  2. History of papillary thyroid carcinoma, stage III.  3. Type 2 diabetes, managed with metformin (controlled with A1c of 5%).  4. Elevated serum cortisol.  5. Ketoacidosis.   PLAN:  1. For postsurgical hypothyroidism, I agree with use of daily Synthroid 100 mcg per day. I attribute her elevated TSH to noncompliance. Goal TSH in this patient with stage III thyroid cancer is in the range of 0.2 to 1. She should follow up with endocrinology as an outpatient.  2. For diabetes, insulin sliding scale seems to be appropriate as she has had fairly reasonable blood sugars and required minimal insulin. Cause of ketoacidosis is unlikely to be diabetes as sugars have been fine and A1c is very good. I question whether may be this is due to malnutrition.  3. For high cortisol, this is likely just due to stress. If she had Cushing's, I would expect uncontrolled diabetes and hypertension as well. We can check a morning cortisol tomorrow to verify it has normalized as I expect it might be slightly better, however, as she is still ill it could be elevated but it would reassure us if it had normalized.   Thank you for the kind request for consultation. I will not be available to see patient over the weekend, however, if she remains hospitalized on Monday then I will see her again at that time.   ____________________________ A. Wendall MolaMelissa Solum, MD ams:cms D: 02/10/2012 18:14:19 ET T: 02/11/2012 11:47:08 ET JOB#: [id number]  cc: A. Wendall MolaMelissa Solum, MD, <Dictator>  Macy MisA. MELISSA SOLUM MD ELECTRONICALLY SIGNED 03/02/2012 13:12

## 2014-07-19 NOTE — Discharge Summary (Signed)
PATIENT NAME:  Krista Ruiz, Krista Ruiz MR#:  [id number] DATE OF BIRTH:  June 13, 1955  DATE OF ADMISSION:  05/26/2013 DATE OF DISCHARGE:  05/27/2013  For a detailed note, please take a look at the history and physical done on admission by Dr. Mordecai MaesSanchez.   DIAGNOSES AT DISCHARGE: As follows:  Hypoglycemia secondary to poor p.o. intake, history of alcohol abuse with no evidence of alcohol withdrawal, diabetes, diabetic neuropathy, hypothyroidism, hypomagnesemia and hypokalemia.   DIET: The patient is being discharged home on a low-sodium, low-fat, American Diabetic Association diet.   ACTIVITY: As tolerated.   Follow up [facility] in the next 2 weeks.   DISCHARGE MEDICATIONS:  Gabapentin 600 mg t.i.d., Synthroid 100 mcg daily, metformin 500 mg b.i.d., metoprolol tartrate 50 mg b.i.d., omeprazole 20 mg b.i.d., pancreatic lipase 3 capsules t.i.d., sucralfate 1 gram t.i.d. before meals.   PERTINENT STUDIES DONE DURING THE HOSPITAL COURSE: Are as follows: A CT scan of the head done without contrast on admission showing aged advanced atrophy without acute intracranial process. A chest x-ray done on admission showing no acute cardiopulmonary disease. Improved aeration of the lungs.   HOSPITAL COURSE:  This is a 59 year old female with medical problems as mentioned above, presented to the hospital with generalized weakness and noted to be hypoglycemic.  1.  Hypoglycemia. This was likely the result of the patient having poor p.o. intake and continuing to drink alcohol, possibly related to underlying alcoholic ketosis. The patient was started on D5 bicarbonate drip. Her blood sugars have significantly improved since admission. She is no longer hypoglycemic. She has been taken off the D5 drip and her blood sugars have remained stable. She is currently taking p.o. well.  2.  History of alcohol abuse.  The patient showed no evidence of alcohol withdrawal. She was maintained on CIWA  protocol, and was strongly advised to quit drinking.  3.  Hypokalemia, hypomagnesemia. This was likely secondary to poor p.o. intake and ongoing alcohol abuse. Her potassium  and magnesium have since then been supplemented and now resolved.  4.  Diabetic neuropathy. The patient was maintained on Neurontin. She will resume that.  5.  Hypothyroidism. The patient was maintained on her Synthroid. She will resume that.  6. History of chronic pancreatitis. The patient will continue her pancreatic supplements as stated.   The patient is a FULL CODE.   Time spent on discharge is 35 minutes.   ____________________________ Rolly PancakeVivek J. Cherlynn KaiserSainani, MD vjs:dmm D: 05/27/2013 16:34:18 ET T: 05/28/2013 10:17:11 ET JOB#: [id number]  cc: Rolly PancakeVivek J. Cherlynn KaiserSainani, MD, <Dictator> [facility] [place] SirenVIVEK J Purva Vessell MD ELECTRONICALLY SIGNED 06/09/2013 22:31
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
## 2014-07-19 NOTE — H&P (Signed)
PATIENT NAME:  Krista Ruiz, Krista Ruiz MR#:  [id number] DATE OF BIRTH:  [birth year]  REFERRING PHYSICIAN: kinner  PRIMARY CARE PHYSICIAN:  None.  CHIEF COMPLAINT: Nausea, vomiting.  HISTORY OF PRESENT ILLNESS: This is a 59 year old African American female with past medical history of alcohol abuse, type 2 diabetes, non-insulin-requiring, complicated by neuropathy, as well as hypothyroidism, status post resection for thyroid cancer, presenting with nausea and vomiting. She is an extremely poor historian, unable to obtain  reliable information given the patient's mental status and medical condition. Through the limited history obtained from her, as well from the ER staff, apparently, she has been nauseous for 2-3 days' duration with associated vomiting of nonbloody, nonbilious emesis. Had extremely poor p.o. intake throughout that time, aside from alcohol. She presented to the hospital for further workup and evaluation from the above symptoms.  The patient has no further complaints at this time, though question the validity of any of her statements.   REVIEW OF SYSTEMS: Unobtainable given patient's mental status and medical condition.   PAST MEDICAL HISTORY: Essential hypertension, depression, not otherwise specified, hypothyroidism after resection for thyroid cancer, hyperlipidemia, unspecified, type 2 diabetes,  non-insulin-requiring, complicated by peripheral neuropathy.   SOCIAL HISTORY: Heavy alcohol use, unknown the last intake, as well as everyday tobacco usage. No drug usage.   FAMILY HISTORY: Coronary artery disease.   ALLERGIES: No known drug allergies.   HOME MEDICATIONS: Include losartan 50 mg p.o. daily, gabapentin 300 mg 2 tabs p.o. 3 times daily, metformin 500 mg p.o. b.i.d., atorvastatin 40 mg p.o. at bedtime, Prilosec 20 mg p.o. b.i.d., levothyroxine 88 mcg p.o. daily, folic acid 1 mg p.o. daily, vitamin D2, 50,000 International Units p.o. q. weekly.   PHYSICAL EXAMINATION: VITAL SIGNS:  Temperature 97.9, heart rate of 106, respirations 18, blood pressure 94/71, saturating 100% on supplemental O2. Weight 39.9 kg, BMI 16.6.  GENERAL: Weak, frail, African American female, currently in minimal distress given mental status. HEAD: Normocephalic, atraumatic.  EYES: Pupils equal, round, and reactive to light. Extraocular muscles intact. No scleral icterus.  MOUTH: Dry mucosal membrane. Dentition intact. No abscess noted.  EARS, NOSE, THROAT: Clear without exudates. No external lesions.  NECK: Supple. No thyromegaly. No nodules. No JVD.  PULMONARY: Clear to auscultation bilaterally without wheezes, rubs, or rhonchi. No use of accessory muscles. Good respiratory effort.  CHEST: Nontender to palpation.  CARDIOVASCULAR: S1, S2, tachycardic. No murmurs, rubs, or gallops. No edema. Pedal pulses 2+ bilaterally.  GASTROINTESTINAL: Soft, nontender, nondistended. No masses. Positive bowel sounds. No hepatosplenomegaly.  MUSCULOSKELETAL: No swelling, clubbing, or edema. Range of motion full in all extremities.  NEUROLOGIC: Cranial nerves II through XII intact. No gross focal neurological deficits. Sensation intact. Reflexes intact.  SKIN: No ulcerations, lesions, rashes, or cyanosis. Skin warm and dry. Turgor intact.  PSYCHIATRIC: Mood and affect flattened.  She is awake, oriented to person. When asked the date she said May 16, and her speech is somewhat garbled as well. Insight and judgment appear to be poor at this time.   LABORATORY DATA: Sodium 146, potassium 3.2, chloride 96, bicarbonate of 7, anion gap of 43, with an albumin of 2.6, BUN of 10, creatinine 1.18, glucose 94. LFTs: Albumin of 2.6, AST of 53, otherwise within normal limits. Troponin less than 0.02. WBC 5.9, hemoglobin 11.6, platelets of 129,000.  Urinalysis, no evidence of infection. ABG performed, pH 7.29, CO2 less than 19. O2 of 167. Lactic acid of 1.6.   IMAGING:  CT abdomen and pelvis performed revealing severe hepatic  steatosis, colonic  diverticulosis, as well as she might have chronic pancreatitis. There were no acute findings. There is question of the appearance of the uterus suggestive of cervical stenosis with probable hematometra.     ASSESSMENT AND PLAN:   A 59 year old [place] American female with history of alcohol abuse, type 2 diabetes, non-insulin-requiring, complicated by neuropathy, presenting with nausea, vomiting,   1. severe anion gap metabolic acidosis, likely in relation to alcohol as well as starvation ketosis. Will check an alcohol level and serum osmolality, calculated osmolar gap and searching for further etiologies.  2.  Hypokalemia. Replace potassium, with goal 4-5. Will check a magnesium level as I suspect this is also low.  3.  Alcohol abuse. Initiate CIWA protocol and check a phosphorus level as well as replace thiamine and folate. 4.  Type 2 diabetes, non-insulin-requiring, complicated by neuropathy. Hold p.o. agents. Add insulin sliding scale, given q. 6 hour Accu-Cheks. 5.  Venous thromboembolism prophylaxis with heparin subcutaneous.   CODE STATUS: The patient is full code.  TIME SPENT:  critical care time 45 minutes.   ____________________________ Cletis Athens. Taziyah Iannuzzi, MD dkh:LT D: 03/18/2014 21:00:21 ET T: 03/18/2014 21:40:43 ET JOB#: [id number]  cc: Cletis Athens. Praveen Coia, MD, <Dictator> Consepcion Utt Synetta Shadow MD ELECTRONICALLY SIGNED 03/19/2014 1:49
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
## 2014-07-19 NOTE — Discharge Summary (Signed)
PATIENT NAME:  Krista Ruiz, Krista Ruiz MR#:  [id number] DATE OF BIRTH:  [birth year]  DATE OF ADMISSION:  10/20/2013 DATE OF DISCHARGE:  10/23/2013  DISPOSITION: To home.   DISCHARGE DIAGNOSES:  1.  Intractable nausea and vomiting secondary to alcoholic gastritis.  2.  Hypothyroidism.  3.  Hypokalemia.  4.  Severe ketoacidosis secondary to alcohol ketoacidosis.  5.  Chronic pancreatitis.  6.alcohol abuse DMII  DISCHARGE MEDICATIONS:  1.  Gabapentin 600 mg p.o. t.i.d.  2.  KCl 20 mEq p.o. daily.  3.  Omeprazole 20 mg p.o. b.i.d.  4.  Metformin 500 mg p.o. b.i.d. 5.  Levothyroxine 100 mcg p.o. daily.  6.  Neurontin 600 mg p.o. t.i.d.   DIET:  Low fat diet.   CONSULTATIONS: None.   HOSPITAL COURSE: This is a 59 year old female patient with history of heavy alcohol abuse, who came in because of intractable nausea, vomiting, and abdominal pain. Look at history and physical for full details.   1.  The patient was admitted to medical service for intractable nausea and vomiting with possible acute alcohol-induced gastritis.  On admission, she was started on p.o.  PPIs, but when I saw her, she was having continuous nausea and vomiting, so we started her on IV Pepcid and patient continued on IV fluids, and along with continued Reglan, IV Zofran and Phenergan.   2.  The patient has alcoholic ketoacidosis.  Lab data showed bicarbonate of 900 on admission, BUN 9 and creatinine 0.85, so she was started on bicarbonate drip with free water along with IV proton pump inhibitors.  The patient's ketoacidosis resolved and her bicarbonate nicely improved, and the patient's bicarbonate improved to 24, so we stopped the IV fluids with bicarbonate and changed to normal saline.   3.  Hypokalemia secondary the persistent nausea, vomiting, and GI losses. The patient did receive IV potassium supplements and also p.o. potassium supplements.   4.  Alcoholic gastritis with nausea and vomiting, resolved.  So we started her  on a liquid diet and advanced to regular diet. The patient tolerated the diet very well, so we told her to continue her PPIs and abstain from alcohol.   5.  Diabetes mellitus, type II. We did not give her metformin here due to hypoglycemic episodes with glucose of 32.  The patient received d50 and orange juice,. The patient's sugars nicely improved to 300s and we discontinued sliding scale with coverage. The patient advised to continue metformin after the discharge.  6.  The patient did not have any physical therapy needs and did not have any home health needs, and she said she stays with her brother and did not have any home health needs.   7.  The patient advised to stay away from alcohol and she was also placed on she will CIWA protocol while she is here. She did not have any withdrawal symptoms.   8.  Regarding her chronic pancreatitis, we continued pancreas supplements since yesterday.   9.  The patient's other diagnoses include diabetic neuropathy, so we continued her on Neurontin.   10.  Hypothyroidism. Continue the Synthroid.   TIME SPENT: More than 30 minutes on this discharge summary.   DISCHARGE VITALS: Temperature 98.4, heart rate 91, blood pressure 113/74. The patient's saturations 100% on room air.   PHYSICAL EXAMINATION:  CARDIOVASCULAR: S1, S2 regular.  LUNGS: Clear to auscultation. No wheeze. No rales.  ABDOMEN: Soft, nontender, nondistended. Bowel sounds present.   TIME SPENT: More than 30 minutes.   [facility]    ____________________________ Katha HammingSnehalatha Zedekiah Hinderman, MD sk:ts D: 10/23/2013 12:41:42 ET T: 10/23/2013 14:00:01 ET JOB#: [id number]  cc: Katha HammingSnehalatha Alquan Morrish, MD, <Dictator> Katha HammingSNEHALATHA Marguerite Barba MD ELECTRONICALLY SIGNED 11/08/2013 0:08

## 2014-07-19 NOTE — Discharge Summary (Signed)
Dates of Admission and Diagnosis:  Date of Admission 18-Mar-2014   Date of Discharge 23-Mar-2014   Admitting Diagnosis acidosis.   Final Diagnosis Anion gap metabolic acidosis- due to alcohol and starvation ketosis Severe dehydration causing Hypotension Hypothyroidism Chronic alcoholism    Chief Complaint/History of Present Illness This is a 59 year old African American female with past medical history of alcohol abuse, type 2 diabetes, non-insulin-requiring, complicated by neuropathy, as well as hypothyroidism, status post resection for thyroid cancer, presenting with nausea and vomiting. She is an extremely poor historian, unable to obtain  reliable information given the patient's mental status and medical condition. Through the limited history obtained from her, as well from the ER staff, apparently, she has been nauseous for 2-3 days' duration with associated vomiting of nonbloody, nonbilious emesis. Had extremely poor p.o. intake throughout that time, aside from alcohol. She presented to the hospital for further workup and evaluation from the above symptoms.  The patient has no further complaints at this time, though question the validity of any of her statements.   Allergies:  No Known Allergies:   Hepatic:  22-Dec-15 17:18   Bilirubin, Total 0.7  Alkaline Phosphatase 106 (46-116 NOTE: New Reference Range 10/15/13)  SGPT (ALT) 23 (14-63 NOTE: New Reference Range 10/15/13)  SGOT (AST)  53  Total Protein, Serum 6.6  Albumin, Serum  2.6  Routine Chem:  22-Dec-15 16:02   Potassium, Serum -    17:18   Glucose, Serum 94  BUN 10  Creatinine (comp) 1.18  Sodium, Serum  146  Potassium, Serum  3.2  Chloride, Serum  96  CO2, Serum  7  Calcium (Total), Serum  7.5  Osmolality (calc) 289  eGFR (African American) >60  eGFR (Non-African American)  50 (eGFR values <71m/min/1.73 m2 may be an indication of chronic kidney disease (CKD). Calculated eGFR, using the MRDR Study  equation, is useful in  patients with stable renal function. The eGFR calculation will not be reliable in acutely ill patients when serum creatinine is changing rapidly. It is not useful in patients on dialysis. The eGFR calculation may not be applicable to patients at the low and high extremes of body sizes, pregnant women, and vegetarians.)  Result Comment CO2 - RESULTS VERIFIED BY REPEAT TESTING.  - NOTIFIED OF CRITICAL VALUE  - READ-BACK PROCESS PERFORMED.  - CALLED TO CASEY PIERCE AT 13903ON 12/22/  - 15 [facility] ELECTROLYTES - RESULTS VERIFIED BY REPEAT TESTING.  Result(s) reported on 18 Mar 2014 at 06:02PM.  Anion Gap  43  Lipase  28 (Result(s) reported on 18 Mar 2014 at 05:55PM.)    23:19   Potassium, Serum 3.5  23-Dec-15 01:50   Potassium, Serum  2.4    03:40   Potassium, Serum  3.1    09:12   Potassium, Serum  2.9  24-Dec-15 04:13   Potassium, Serum  3.1  25-Dec-15 04:58   Potassium, Serum  3.1  26-Dec-15 04:27   Potassium, Serum 3.5  Cardiac:  22-Dec-15 17:18   Troponin I < 0.02 (0.00-0.05 0.05 ng/mL or less: NEGATIVE  Repeat testing in 3-6 hrs  ifclinically indicated. >0.05 ng/mL: POTENTIAL  MYOCARDIAL INJURY. Repeat  testing in 3-6 hrs if  clinically indicated. NOTE: An increase or decrease  of 30% or more on serial  testing suggests a  clinically important change)   Pertinent Past History:  Pertinent Past History Essential hypertension, depression, not otherwise specified, hypothyroidism after resection for thyroid cancer, hyperlipidemia, unspecified, type 2 diabetes,  non-insulin-requiring, complicated  by peripheral neuropathy.   Hospital Course:  Hospital Course 59 yo female w/ hx of ETOH abuse, DM, hx of previous starvation, hyperlipidemia, hypothroidism, GERd, DM neuropathy, came into hospital due to altered mental status, confusion and noted to be in severe AG metabolic acidosis.   1. AG Metabolic acidosis - this is likely due to ETOH abuse and  starvation ketosis.  Osmolar GAP elevetaed but Methanol, Ethylene glycol level is normal.  - cont. supportive care w/ IV fluids, was given Bicarb drip on admission - now taken off as acidosis resolved.   - follow serial met B's. remained stable inlast few days. - nephro following and appreciate help.  - now with alkalosis due to bicarb drip- stopped- and added saline IV fluid.  2. Hypotension - likely due to dehydration and poor PO intake.  - cont. IV fluids,  required IV Levophed. Keep MAP > 60.  - pt's normal baseline BP is on lower side, totally alert and oriented with lower BP currently. - now BP is stable without levophed.   3. HTN - presently hypotensive so hold anti-HTN for now. BP normal and stable at discharge.  4. Hypothyroidism - cont. synthroid.   5. AMS - likely metabolic encephalopathy related to acidosis.  better now with correction.  6. ETOH abuse - cont. CIWA.  7. DM - cont. SSI. PT suggest HHA and PT, but she lives with family and want to go without any services.   Condition on Discharge Stable   DISCHARGE INSTRUCTIONS HOME MEDS:  Medication Reconciliation: Patient's Home Medications at Discharge:     Medication Instructions  metformin 500 mg oral tablet  1 tab(s) orally 2 times a day   omeprazole 20 mg oral delayed release capsule  1 cap(s) orally 2 times a day   gabapentin 300 mg oral capsule  2 cap(s) orally 3 times a day   levothyroxine 88 mcg (0.088 mg) oral tablet  1 tab(s) orally once a day   atorvastatin 40 mg oral tablet  1 tab(s) orally once a day (at bedtime)   vitamin d2 50,000 intl units oral capsule  1 cap(s) orally once a week   folic acid 1 mg oral tablet  1 tab(s) orally once a day   ensure plus *  237 milliliter(s) orally 3 times a day (with meals)   ferrous sulfate 325 mg oral delayed release tablet  1 tab(s) orally 2 times a day    PRESCRIPTIONS: PRINTED AND PLACED ON CHART  STOP TAKING THE FOLLOWING MEDICATION(S):    losartan 50 mg  oral tablet: 1 tab(s) orally once a day  Physician's Instructions:  Diet Low Sodium  Carbohydrate Controlled (ADA) Diet   Diet Consistency Regular Consistency   Activity Limitations As tolerated   Return to Work Not Applicable   Time frame for Follow Up Appointment 1-2 weeks  PMD.   TIME SPENT:  Total Time: Greater than 30 minutes   Electronic Signatures: Vaughan Basta (MD)  (Signed 30-Dec-15 08:09)  Authored: ADMISSION DATE AND DIAGNOSIS, CHIEF COMPLAINT/HPI, Allergies, PERTINENT LABS, PERTINENT PAST HISTORY, HOSPITAL COURSE, DISCHARGE INSTRUCTIONS HOME MEDS, PATIENT INSTRUCTIONS, TIME SPENT   Last Updated: 30-Dec-15 08:09 by Vaughan Basta (MD)
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
## 2016-01-13 IMAGING — CR DG CHEST 1V PORT
1 series · 1 of 1 positions shown · non-contrast
Comparison: DG CHEST 2V dated 02/27/2012;

CLINICAL DATA: Shortness of breath, hypotension, hypoglycemia

EXAM:
PORTABLE CHEST - 1 VIEW

[ap]
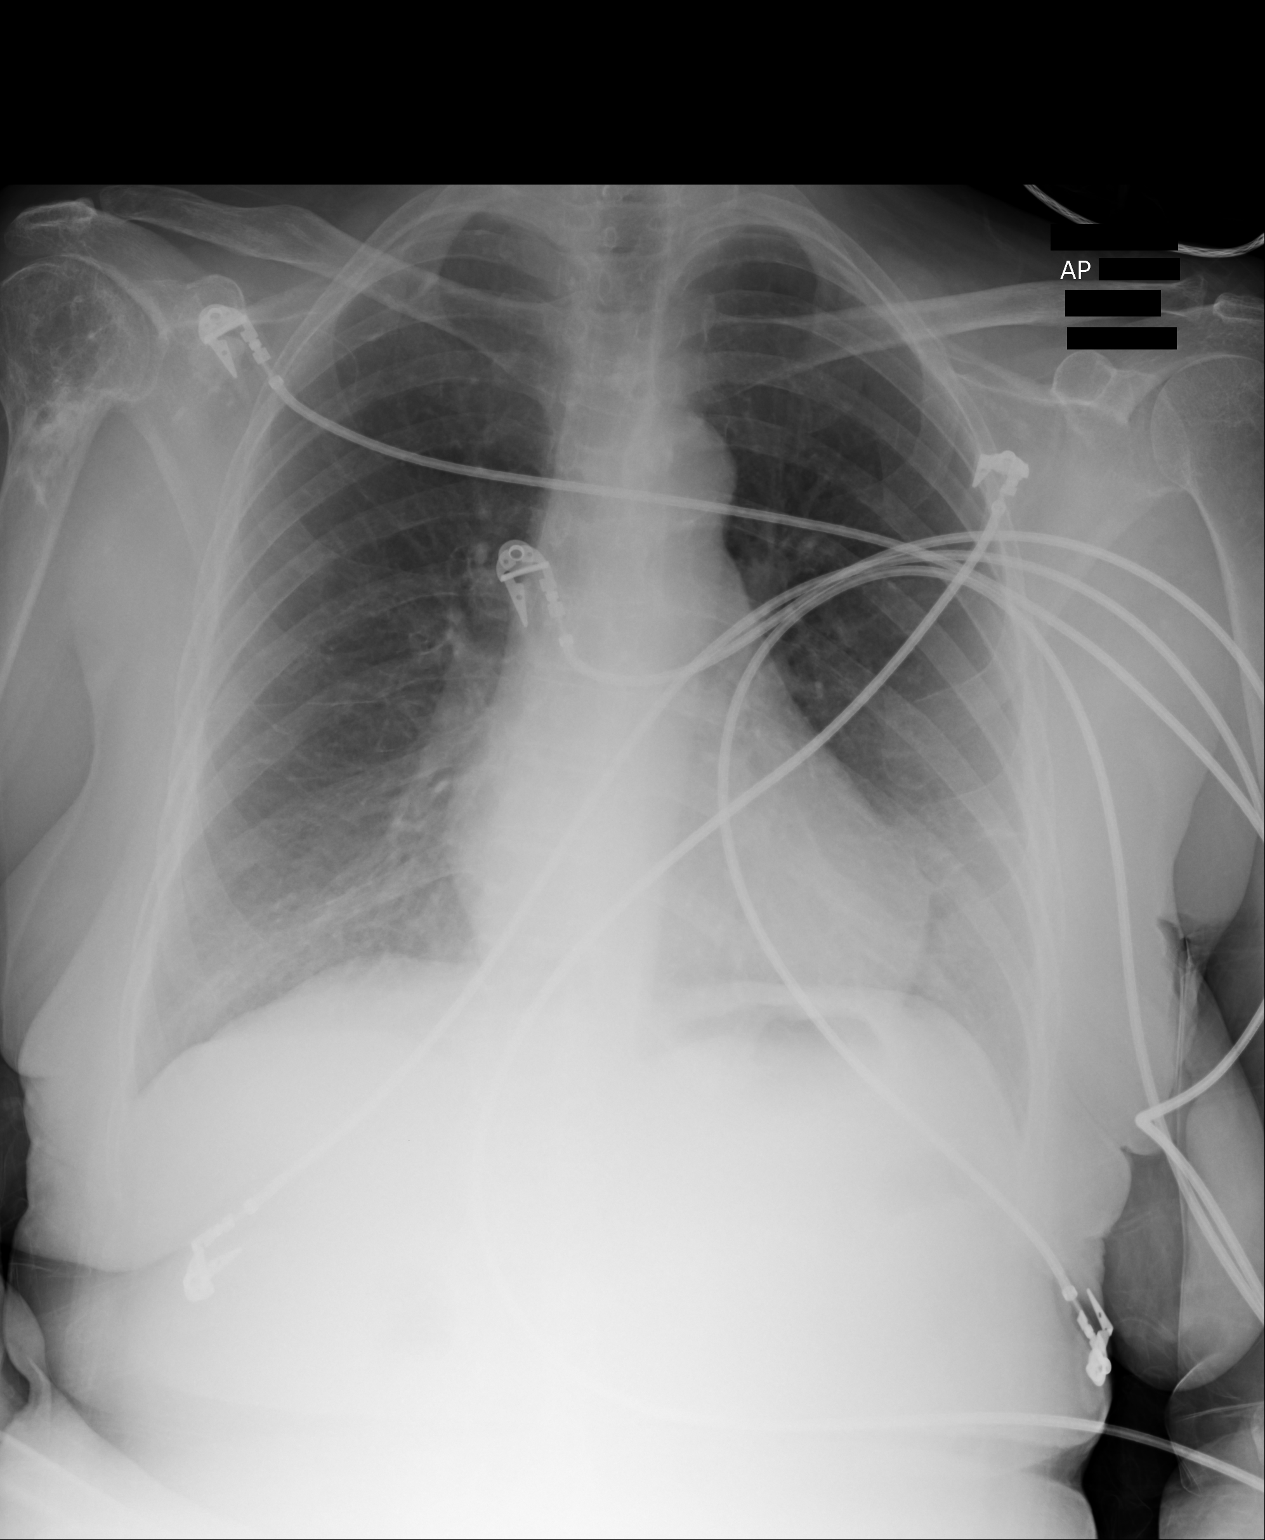

[1 of 1 positions shown; findings below may reference images not displayed]

DG CHEST 2V dated
02/23/2012; DG CHEST 1V PORT dated 02/16/2012; DG CHEST 1V PORT
dated 11/08/2010
FINDINGS: Grossly unchanged borderline enlarged cardiac silhouette and
mediastinal contours. Veiling opacities overlying the bilateral
lungs are favored to represent overlying breast tissue. Overall
improved aeration of the lungs with persistent minimal bibasilar
opacities. No new focal airspace opacities. No pleural effusion or
pneumothorax. No definite evidence of edema. Grossly unchanged bones
including mixed lytic and sclerotic lesion within the right humeral
head with associated possible deformity.
IMPRESSION: 1.  No acute cardiopulmonary disease.
2. Improved aeration the lungs suggests resolved edema and
atelectasis. Residual bibasilar opacities favored to represent
atelectasis.
3. Unchanged mixed lytic and sclerotic lesion within the right
humeral head, grossly stable since the 5675 examination.

## 2016-01-13 IMAGING — CT CT HEAD WITHOUT CONTRAST
1 series · 16 of 30 positions shown, 20 images · non-contrast
Comparison: CT HEAD W/O CM dated 02/09/2012

CLINICAL DATA: Hypoglycemia, weakness

EXAM:
CT HEAD WITHOUT CONTRAST
TECHNIQUE: Contiguous axial images were obtained from the base of the skull
through the vertex without intravenous contrast.

[Series 2: head wo · axial · 0.39mm/px · z∈[-132,-6]mm · 16 of 32 slices shown, 20 images]
[im 2/32  brain]
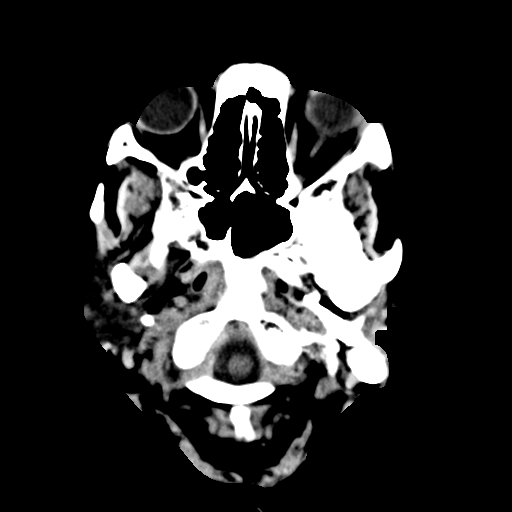
[im 2/32  bone]
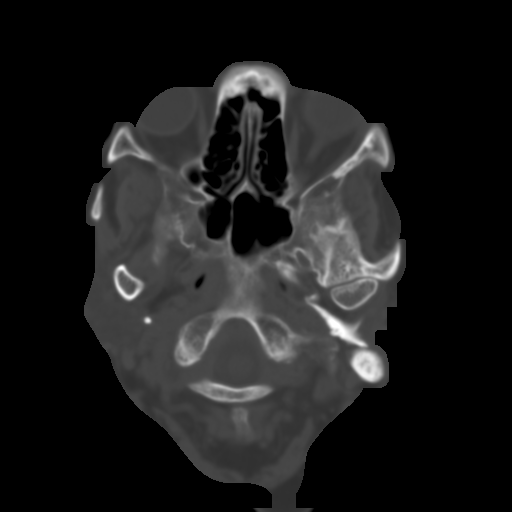
[im 4/32  brain]
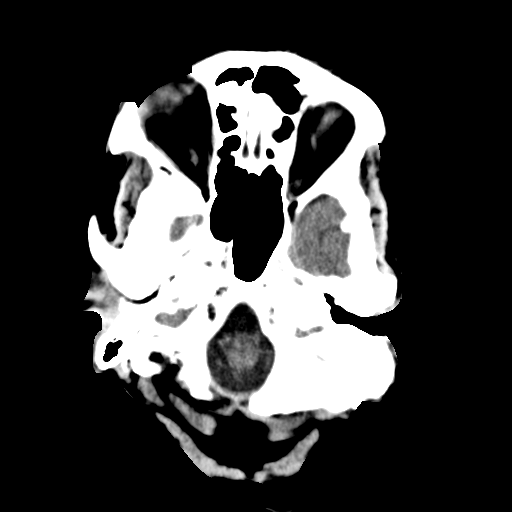
[im 6/32  brain]
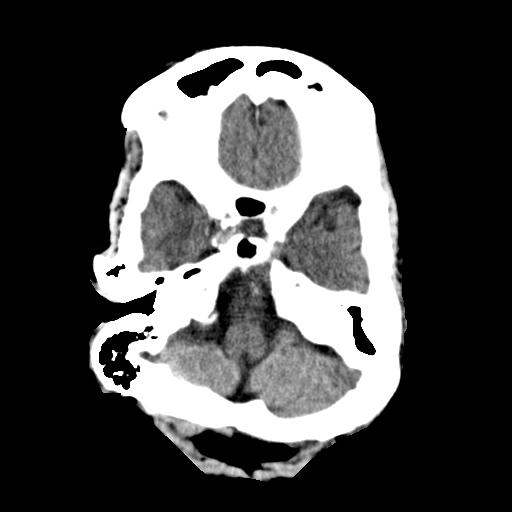
[im 8/32  brain]
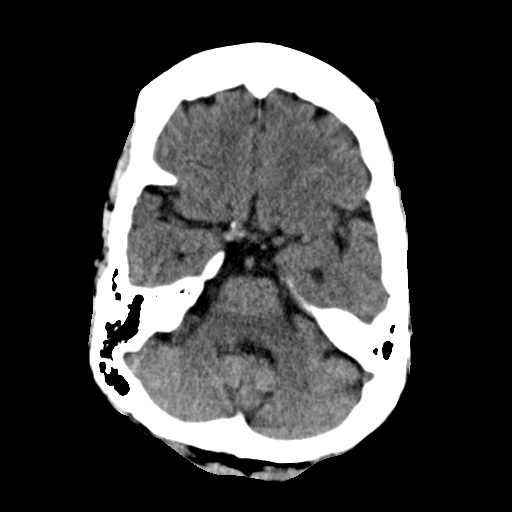
[im 9/32  brain]
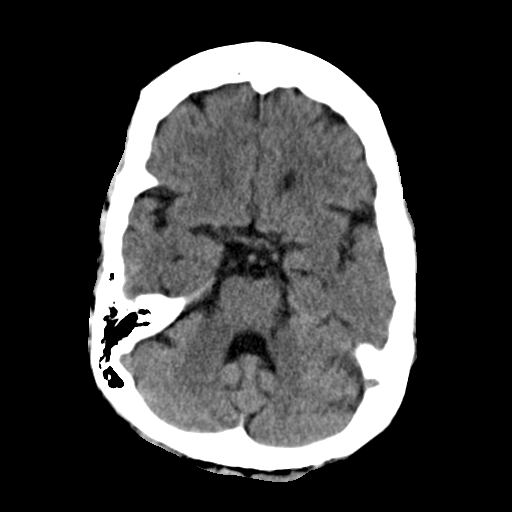
[im 9/32  bone]
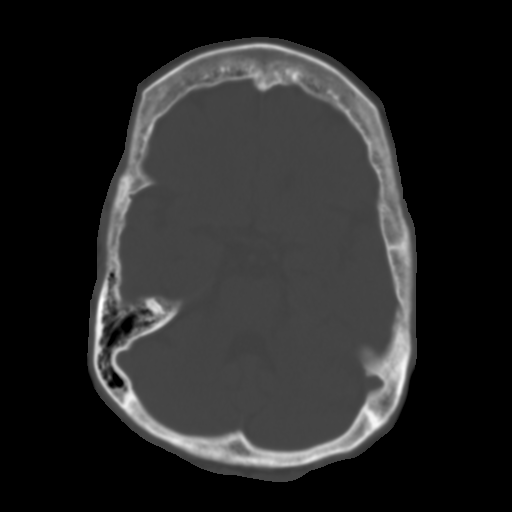
[im 11/32  brain]
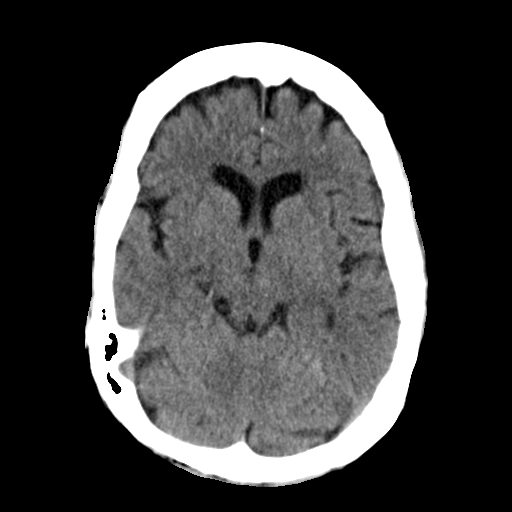
[im 13/32  brain]
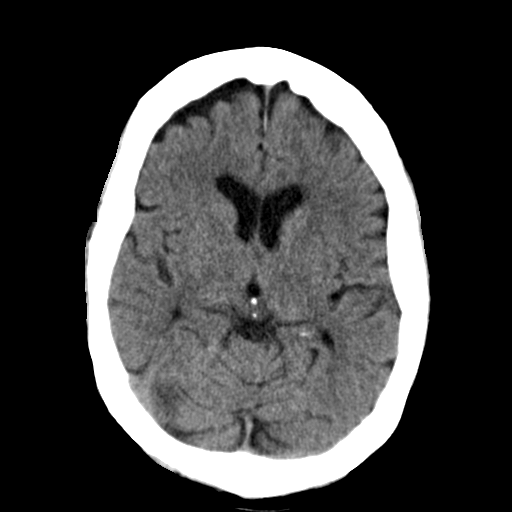
[im 15/32  brain]
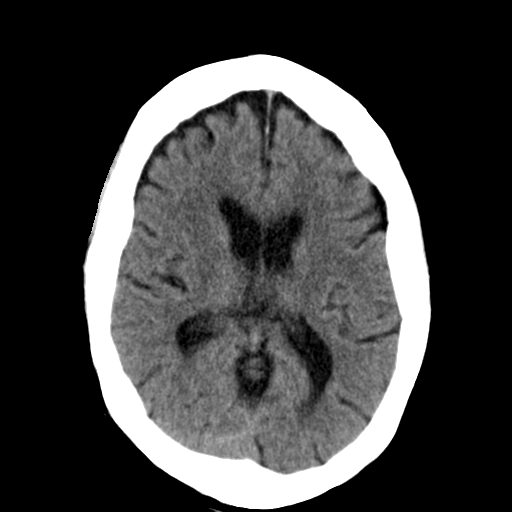
[im 17/32  brain]
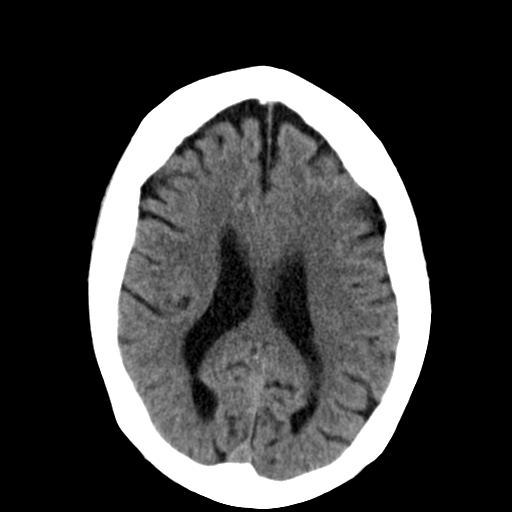
[im 17/32  bone]
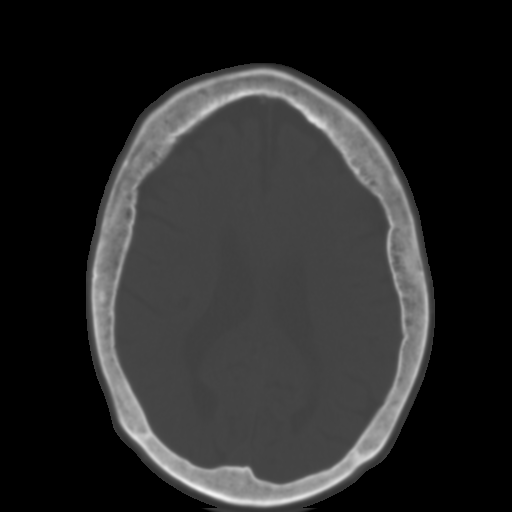
[im 19/32  brain]
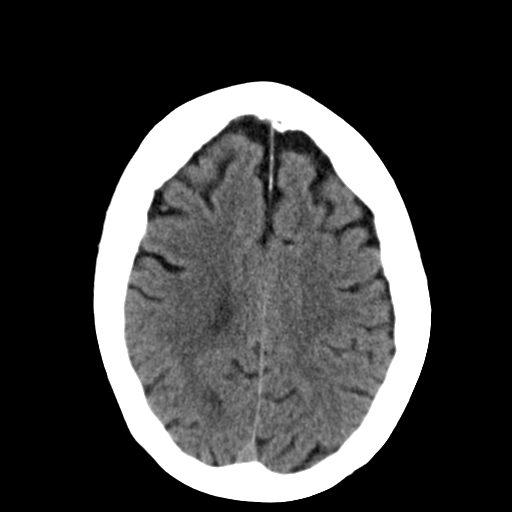
[im 21/32  brain]
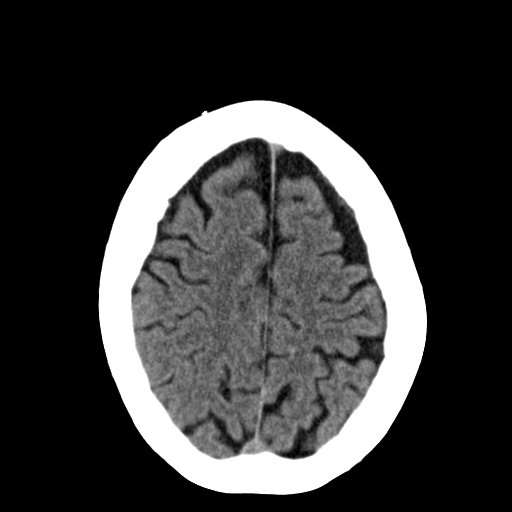
[im 23/32  brain]
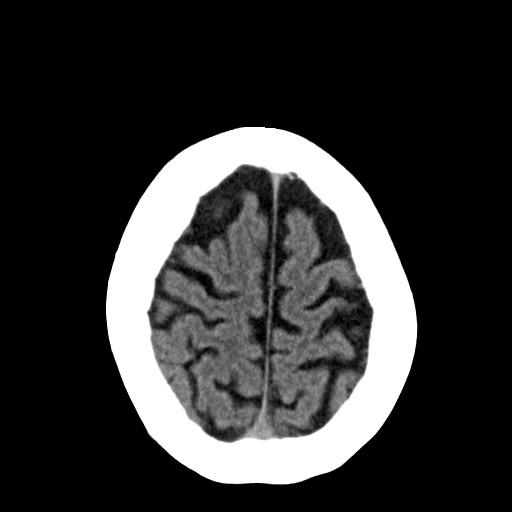
[im 24/32  brain]
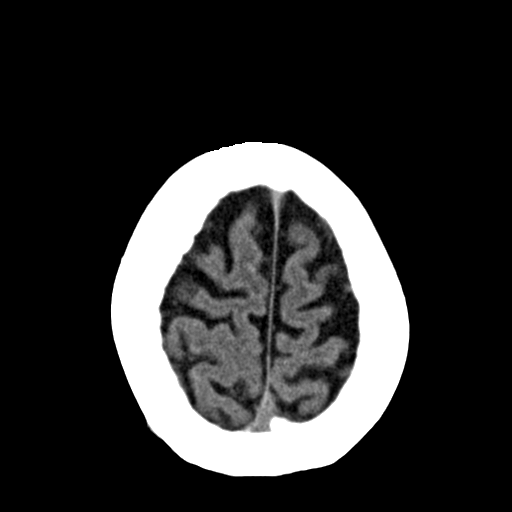
[im 24/32  bone]
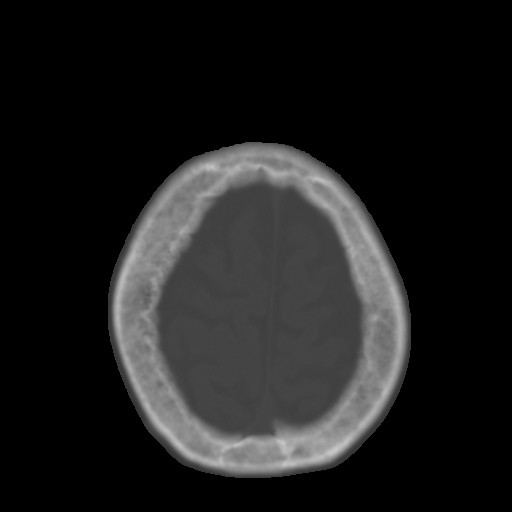
[im 26/32  brain]
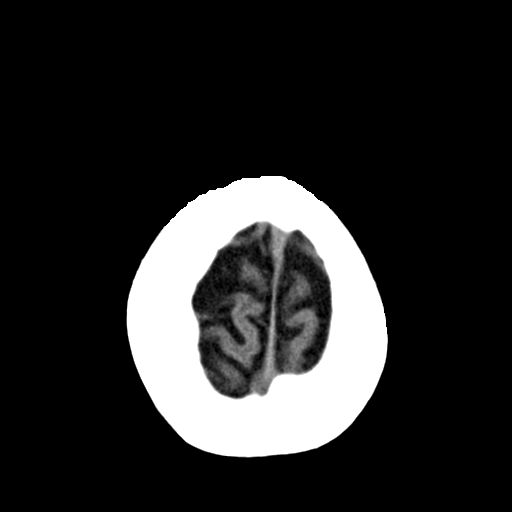
[im 28/32  brain]
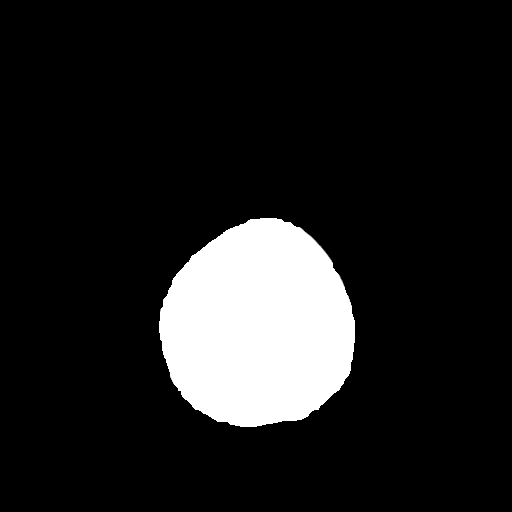
[im 30/32  brain]
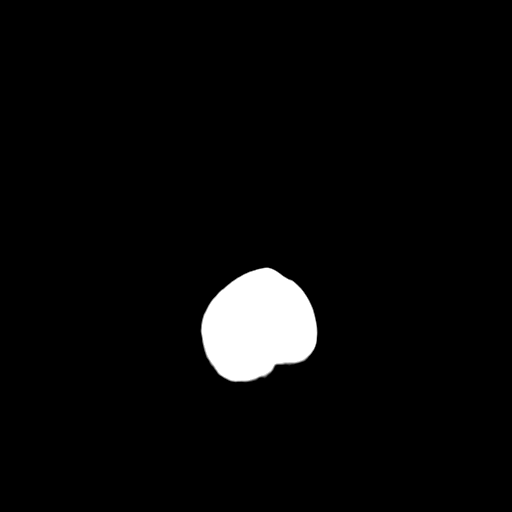

[16 of 30 positions shown; findings below may reference images not displayed]

FINDINGS: Age advanced atrophy with sulcal prominence and prominence of the
bifrontal extra-axial spaces. Centralized volume loss with mild ex
vacuo dilatation of the ventricular system. Gray-white
differentiation is maintained. No CT evidence acute large territory
infarct. No intraparenchymal or extra-axial mass or hemorrhage.
Unchanged size and configuration of the ventricles and basilar
cisterns. No midline shift. An osteoid osteoma is noted within in
the right frontal sinus (image 4, series 3). The remaining paranasal
sinuses and mastoid air cells are normally aerated. Incidental note
is made of mild hyperostosis frontalis. Regional soft tissues appear
normal.
IMPRESSION: Age advanced atrophy without acute intracranial process.

## 2017-02-25 DEATH — deceased
